# Patient Record
Sex: Male | Born: 1950 | Race: White | Hispanic: No | Marital: Married | Smoking: Former smoker
Health system: Southern US, Community
[De-identification: ages and names within clinical notes are randomized; demographics above are authoritative.]

## PROBLEM LIST (undated history)

## (undated) DIAGNOSIS — K219 Gastro-esophageal reflux disease without esophagitis: Secondary | ICD-10-CM

## (undated) DIAGNOSIS — E291 Testicular hypofunction: Secondary | ICD-10-CM

## (undated) DIAGNOSIS — I10 Essential (primary) hypertension: Secondary | ICD-10-CM

## (undated) DIAGNOSIS — G4733 Obstructive sleep apnea (adult) (pediatric): Principal | ICD-10-CM

## (undated) DIAGNOSIS — Z8719 Personal history of other diseases of the digestive system: Secondary | ICD-10-CM

## (undated) DIAGNOSIS — J342 Deviated nasal septum: Secondary | ICD-10-CM

## (undated) DIAGNOSIS — E785 Hyperlipidemia, unspecified: Secondary | ICD-10-CM

## (undated) DIAGNOSIS — M1990 Unspecified osteoarthritis, unspecified site: Secondary | ICD-10-CM

## (undated) DIAGNOSIS — T1490XA Injury, unspecified, initial encounter: Secondary | ICD-10-CM

## (undated) HISTORY — PX: TOOTH EXTRACTION: SUR596

## (undated) HISTORY — PX: MOUTH SURGERY: SHX715

## (undated) HISTORY — PX: CARDIAC CATHETERIZATION: SHX172

## (undated) HISTORY — PX: OTHER SURGICAL HISTORY: SHX169

## (undated) HISTORY — PX: TONSILLECTOMY: SUR1361

## (undated) HISTORY — DX: Hyperlipidemia, unspecified: E78.5

## (undated) HISTORY — DX: Obstructive sleep apnea (adult) (pediatric): G47.33

## (undated) HISTORY — DX: Testicular hypofunction: E29.1

---

## 1998-06-18 ENCOUNTER — Ambulatory Visit (HOSPITAL_COMMUNITY): Admission: RE | Admit: 1998-06-18 | Discharge: 1998-06-18 | Payer: Self-pay | Admitting: Gastroenterology

## 1999-12-08 ENCOUNTER — Inpatient Hospital Stay (HOSPITAL_COMMUNITY): Admission: EM | Admit: 1999-12-08 | Discharge: 1999-12-09 | Payer: Self-pay | Admitting: Emergency Medicine

## 1999-12-08 ENCOUNTER — Encounter: Payer: Self-pay | Admitting: Emergency Medicine

## 2005-01-04 ENCOUNTER — Inpatient Hospital Stay (HOSPITAL_COMMUNITY): Admission: RE | Admit: 2005-01-04 | Discharge: 2005-01-07 | Payer: Self-pay | Admitting: Orthopedic Surgery

## 2005-12-21 ENCOUNTER — Ambulatory Visit: Payer: Self-pay | Admitting: Sports Medicine

## 2005-12-23 IMAGING — CR DG HIP 1V PORT*R*
1 series · 1 of 1 positions shown · non-contrast
Comparison: none

CLINICAL DATA: Osteoarthritis.  Status-post right THR.
 PORTABLE RIGHT HIP:
 Two views of the right hip made with the portable apparatus reveals a total hip replacement to be in place on the right.  The alignment of the proximal femoral and acetabular components are thought to be satisfactory.  The most distal extent of the femoral component is not seen to full advantage but appears to be in satisfactory position.

[view not recorded]
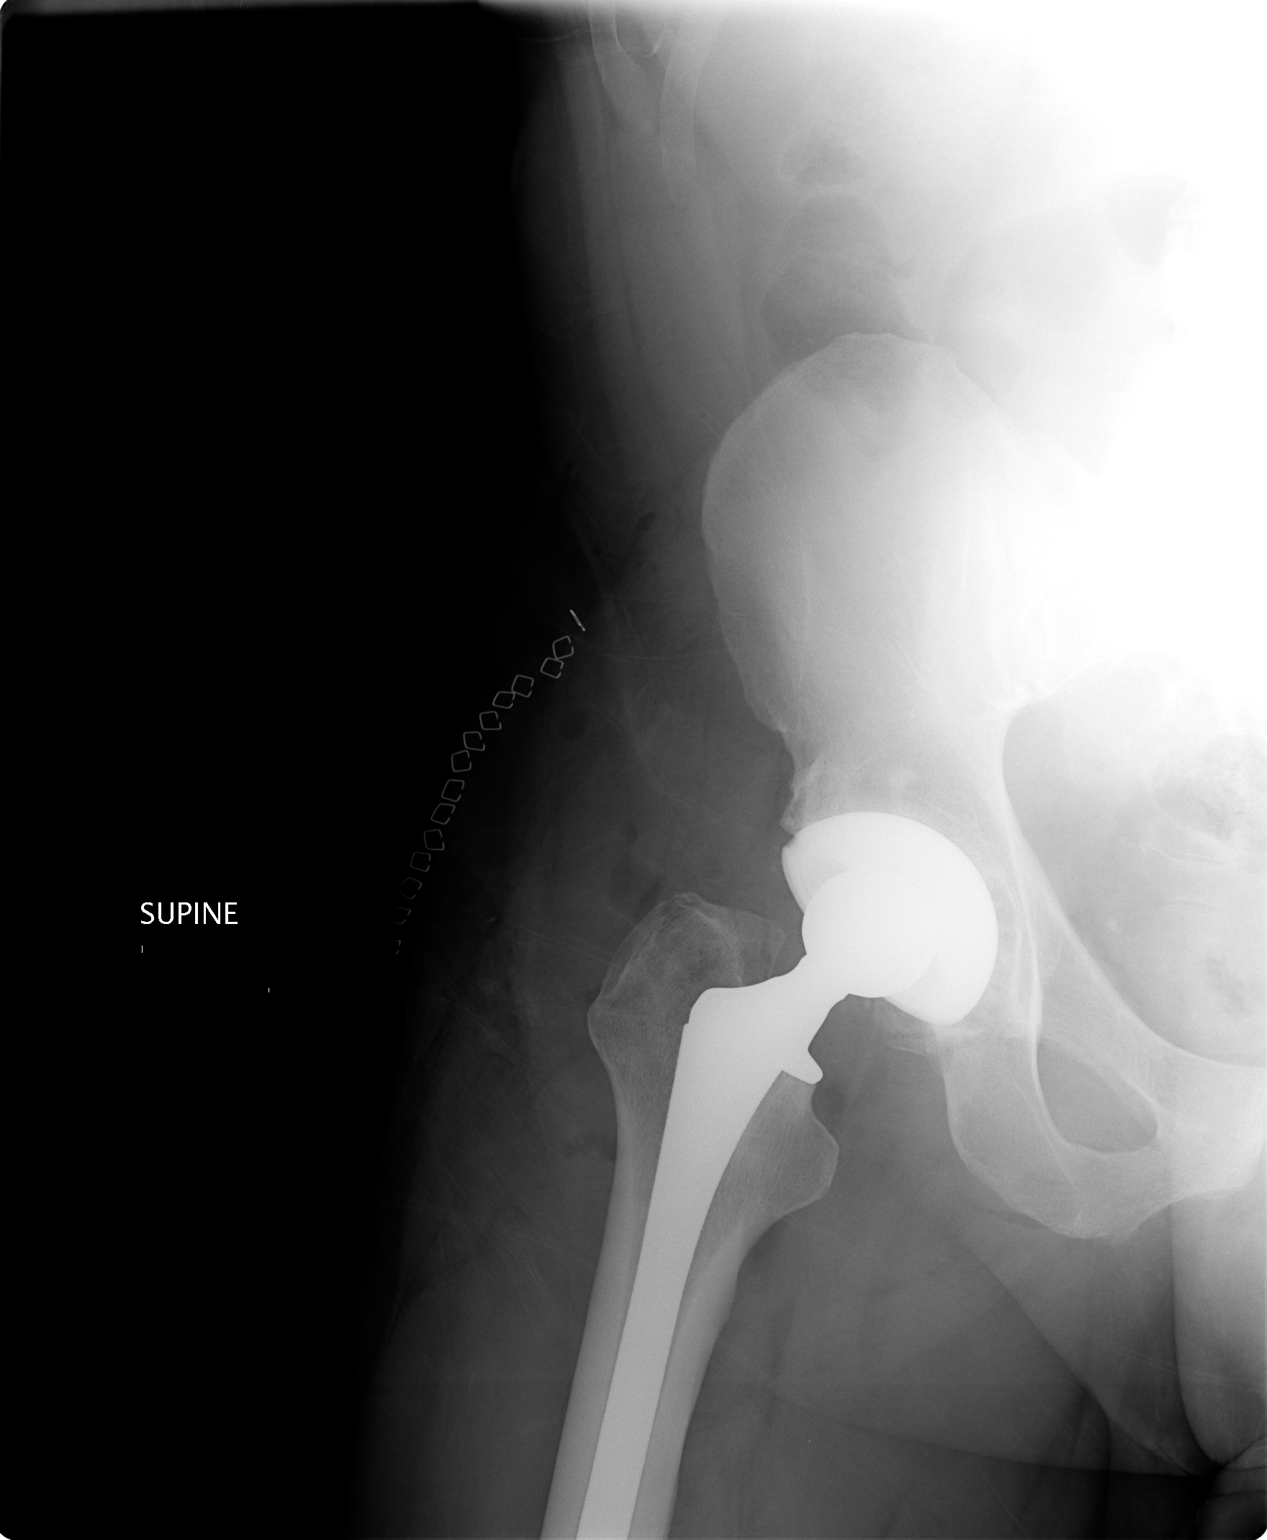

[1 of 1 positions shown; findings below may reference images not displayed]

IMPRESSION: Right total hip replacement appears to be in satisfactory position and alignment.

## 2008-09-04 ENCOUNTER — Ambulatory Visit (HOSPITAL_COMMUNITY): Admission: RE | Admit: 2008-09-04 | Discharge: 2008-09-04 | Payer: Self-pay | Admitting: Orthopedic Surgery

## 2011-04-30 NOTE — Discharge Summary (Signed)
Charles [name], [name]              ACCOUNT NO.:  [id number]   MEDICAL RECORD NO.:  [id number]          PATIENT TYPE:  INP   LOCATION:  5015                         FACILITY:  [facility]   PHYSICIAN:  Legrand Pitts. Duffy, P.A.   DATE OF BIRTH:  1951-06-27   DATE OF ADMISSION:  01/04/2005  DATE OF DISCHARGE:  01/07/2005                                 DISCHARGE SUMMARY   ADMISSION DIAGNOSES:  1.  End stage osteoarthritis, right hip.  2.  Hypertension with mild white coat syndrome.  3.  Degenerative disk disease, cervical spine.  4.  Deviated septum.  5.  History of gastroesophageal reflux disease.  6.  History of collapsed tympanic membrane on the left side.   DISCHARGE DIAGNOSES:  1.  End stage osteoarthritis, right hip, status post right total hip      arthroplasty, anterior approach.  2.  Acute blood loss anemia secondary to surgery.  3.  Pyrexia.  4.  Hypertension with mild white coat syndrome.  5.  Degenerative disk disease, cervical spine.  6.  Deviated septum.  7.  History of gastroesophageal reflux disease.  8.  History of collapsed tympanic membrane on the left.   SURGICAL PROCEDURE:  On January 04, 2005, Mr. Charles [name] underwent a right  total hip arthroplasty anterior approach by Mila Homer. Sherlean Foot, M.D. assisted  by Jamelle Rushing, P.A. and [place], [place] student.  He had a Trilogy  acetabular system shell without holes, porous coat 60 mm outer diameter with  a Trilogy acetabular system, longevity cross-linked polyethylene liner 32 mm  inner diameter 60 mg outer diameter.  A VerSys hip femoral stem, beaded full  coat 12-14 neck taper, standard neck offset size 12 standard body, 160 mm  stem length. And a VerSys femoral head 12-14 taper, nonskirted 32 mm  diameter +7 mm neck length.   COMPLICATIONS:  None.   CONSULTATIONS:  1.  Physical therapy and case management consult, January 05, 2005.  2.  Case management consult advanced home care January 06, 2005.   HISTORY OF PRESENT  ILLNESS:  This 60 year old white male patient presented  to Dr. Sherlean Foot with an 8 year history of gradual onset progressive right hip  pain.  It is now intermittent dull, aching sensation in the right groin with  occasional radiation to his knee and midtibia.  It increases with impact  activities or walking and decreases with avoidance of these activities  It  catches.  He has difficult putting on his socks and shoes.  X-rays show  arthritic changes and he has failed conservative treatment.  Because of  this, he is presenting for a right hip replacement.   HOSPITAL COURSE:  Charles [name] tolerated the surgical procedure well without  immediately postoperative complications.  He was transferred to 5000.  On  postoperative day 1 he had some dizziness when out of bed.  T-max 100.8,  vitals otherwise stable with blood pressure a bit low at 108/69.  Hemoglobin  was 10.6, hematocrit 30.6, leg was neurovascularly intact.  Pain meds were  switched to do the nausea and dizziness.  Blood pressure  meds were held.  He  was started on therapy per protocol.   He did well over the next several days.  The dizziness resolved.  T-max on  January 06, 2005 was 101.1.  He did have some swelling in the thigh and that  was treated with compression and holding the Lovenox for one dose.  On  January 07, 2005 he is ready for discharge home and he will be discharged  home later today.   DISCHARGE INSTRUCTIONS:  Diet:  He can resume his regular prehospitalization  diet.  Medications:  He may resume his prehospitalization meds.  These include  1.  Lotrel 5/20 mg one tablet p.o. q.a.m.  2.  Hydrochlorothiazide 25 mg p.o. q.a.m.  3.  Chondroitin and glucosamine 600/750 two tablets p.o. q.a.m.  Additional medications at this time include  1.  Lovenox 40 mg subcu q. day with the last dose to be January 18, 2005.  2.  Vicodin 5/500 mg one to two tablets p.o. q.4h. p.r.n. for pain, max of      eight a day.  50 of these  with no refill.  Activity:  Can be out of bed weightbearing as tolerated on the right leg  with the use of a walker.  He is to continue physical therapy, home health  physical therapy for advanced home care.  Please see the blue total knee  discharge sheet for further activity instructions.  Wound care:  Please keep the right hip incision clean and dry.  Can apply  Ace wraps as needed to help with the swelling.  Please see the blue total  knee discharge sheet for further wound care instructions.   FOLLOW UP:  He needs to follow up with Dr. Sherlean Foot in our office on February  7.  He needs to call [phone] for that appointment.   LABORATORY DATA:  Chest x-ray done on December 31, 2004 showed mild chronic  bronchitic changes.  Hip x-ray done on January 23 showed the right hip  replacement in satisfactory position and alignment.   January 24, hemoglobin 10.6, hematocrit 30.6.  On January 25, hemoglobin  9.7, hematocrit 28 and on January 26, hemoglobin 9.9, hematocrit 28.3, white  blood cell count 7200 and platelet count 243,000.  On January 19, glucose  113.  On January 24 it was 122 with calcium 8.3 and on January 25 glucose  115, BUN 4, creatinine 1, and calcium 8.3.  All other laboratory studies  were within normal limits.      KED/MEDQ  D:  01/07/2005  T:  01/07/2005  Job:  [id number]

## 2011-04-30 NOTE — Discharge Summary (Signed)
[place]. [facility]  Patient:    Charles [name]                      MRN: [id number] Adm. Date:  [id number] Disc. Date: 12/09/99 Attending:  Alric Quan Dictator:   Leonides Cave, P.A. CC:         Cecil Cranker, M.D. [facility]             Teena Irani. Arlyce Dice, M.D., primary care                  Referring Physician Discharge [place]  DATE OF BIRTH:  [birth year]  DISCHARGE DIAGNOSES: 1. Noncardiac chest pain with a normal cardiac catheterization on December 09, 1999. 2. Hypertension.  HISTORY:  A 60 year old white male with no previous cardiac history.  Woke up on the morning of December 08, 1999 with intermittent left arm pain.  Started complaining of nausea, dizziness, and mild shortness of breath and had a presyncopal spell, and went outside and felt better on the morning of admission. Patient denied chest pain but states he did have his arm pain at the time of the spell.  The patient felt fine since but had never had a similar episode.  He went to see primary M.D. at [facility], who sent him to the emergency department for further evaluation.  HOSPITAL COURSE:  The patient was set up for cardiac catheterization, which was  performed by Dr. Veneda Melter on December 09, 1999.  Results are left main normal; LAD with three diagonals with very minimal disease; left circumflex with a trivial obtuse marginal one branch and bifurcating OM-2.  The RCA was dominant with a very mild disease.  There was no mitral regurgitation and patient had a normal LV with an ejection fraction of greater than 60%.  The diagnosis was noncardiac chest pain and normal LV function.  DISPOSITION:  Patient was subsequently discharged home with a trial of Prilosec 20 mg q.d.  He was told to continue his Prinivil 20 mg q.d.  ACTIVITY:  Patient was instructed to undergo no heavy lifting, driving, or sexual activity for two days.  DIET:  He was  told to adhere to a low fat/low cholesterol diet.  SITE CARE:  He was told that if bleeding or swelling occurred at the catheterization site he should call [facility] office immediately.  FOLLOW-UP:  He will not need to follow-up in the [facility] office.  He can follow up with Dr. Arlyce Dice, his primary care physician, as needed or if his symptoms persist. DD:  12/09/99 TD:  12/09/99 Job: [id number]

## 2011-04-30 NOTE — H&P (Signed)
Charles [name], Charles [name]              ACCOUNT NO.:  [id number]   MEDICAL RECORD NO.:  [id number]          PATIENT TYPE:  INP   LOCATION:  NA                           FACILITY:  [facility]   PHYSICIAN:  Mila Homer. Sherlean Foot, M.D. DATE OF BIRTH:  [birth year]   DATE OF ADMISSION:  DATE OF DISCHARGE:                                HISTORY & PHYSICAL   DATE OF ADMISSION:  January 04, 2005   CHIEF COMPLAINT:  Right hip pain for the last 8 years.   HISTORY OF PRESENT ILLNESS:  This 60 year old white male presented to Dr.  Sherlean Foot with an 8-year history of gradual onset but progressively worsening  right hip pain for the past 8 years.  The pain has only gotten worse over  the last 12 months or so.  He has no known injury or prior surgery to his  hip but he is having problems with the pain and decreased range of motion.   At this point the pain is pretty much an intermittent, dull, achy sensation  in the right groin with occasional radiation down to his knee and his mid  tibia.  The pain increases with any impact activities or walking and then  decreases if he just avoids those activities.  The hip does feel like it  catches at times.  He has some difficulty putting on his socks and shoes and  the pain does occasionally keep him up at night.  There is no grinding,  locking, or giving way.  He denies any paresthesias associated with the pain  or any back pain.  He is currently taking chondroitin and glucosamine but no  other medicines for pain.  He has not received any cortisone injections.   ALLERGIES:  No known drug allergies.   CURRENT MEDICATIONS:  1.  Lotrel 5/20 mg one tablet p.o. q.a.m.  2.  Hydrochlorothiazide 25 mg one tablet p.o. q.a.m.  3.  Glucosamine and chondroitin sulfate 750/600 mg two tablets p.o. q.a.m.   PAST MEDICAL HISTORY:  1.  He has had hypertension for the last 20 years.  2.  Questionable history of reflux or a hiatal hernia in 1988 and 1989 but      no problem since that  time.   He denies any history of diabetes mellitus, thyroid disease, peptic ulcer  disease, heart disease, asthma, or any other chronic medical condition other  than noted previously.   PAST SURGICAL HISTORY:  1.  Oral surgery in 1978 and 1980.  2.  Myringotomy tube as a child.  3.  Myringotomy tube left ear by Dr. Renato Battles in about 1999 or 2000.  4.  Tonsillectomy at age 73.  5.  LASIK surgery on both eyes by Dr. Delaney Meigs in 2003.   He denies any complications from the above-mentioned procedures.   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  He does not smoke or use any drugs.  He is  married and has two sons.  He and his wife live in a two-story house with  six steps into the main entrance.  His bedroom is on the second floor.  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  His medical doctor is Dr. Dara Hoyer at [facility] and  his phone number is [phone].   FAMILY HISTORY:  Mother died at the age of 48 with lung cancer.  He does not  know anything about his father.  He has one brother who passed away at age  23 with a car accident.  His sons are age 37 and 16 and they are alive and  well.   REVIEW OF SYSTEMS:  He does have a history of a collapsed tympanic membrane  and has some chronic tinnitus due to that.  He has some degenerative disc  disease in his cervical spine at the C4-5 level.  He does have a deviated  septum.  He is complaining of some right shoulder impingement syndrome.  He  will have a Living Will over the next week and a power of attorney is his  wife, Charles [name].  All other systems are negative and  noncontributory.   PHYSICAL EXAMINATION:  GENERAL:  Well-developed, well-nourished white male  in no acute distress.  Talks easily with the examiner.  Walks with a very  slight limp.  Mood and affect are appropriate.  Height 6 feet 0  inches,  weight 226 pounds, BMI is 29.5.  VITAL SIGNS:  Temperature 98.7 degrees Fahrenheit, pulse 72, respirations  12, and blood pressure 148/100.  At the end of the interview, his blood  pressure was 118/74.  HEENT:  Normocephalic, atraumatic, without frontal or maxillary sinus  tenderness to palpation.  Conjunctivae pink, sclerae anicteric.  PERRLA.  EOMs intact.  No visual external ear deformities.  Hearing grossly intact.  Tympanic membrane on the right is pearly gray with good light reflex.  The  left one appears to be fairly scarred and I cannot see a light reflex.  Nose  and nasal septum midline.  Nasal mucosa pink and moist without exudates or  polyps noted.  Buccal mucosa pink and moist.  Good dentition.  Pharynx  without erythema or exudates.  Tongue and uvula midline.  Tongue without  vesiculations and uvula rises equally with phonation.  NECK:  No visible masses or lesions noted.  Trachea midline.  No palpable  lymphadenopathy nor thyromegaly.  Carotids +2 bilaterally without bruits.  Full range of motion and nontender to palpation along the cervical spine.  CARDIOVASCULAR:  Heart rate and rhythm regular.  S1 and S2 present without  rubs, clicks, or murmurs noted.  RESPIRATORY:  Respirations even and unlabored.  Breath sounds clear to  auscultation bilaterally without rales or wheezes noted.  ABDOMEN:  Rounded abdominal contour.  Bowel sounds present x4 quadrants.  Soft, nontender to palpation without hepatosplenomegaly nor CVA tenderness.  Femoral pulses +2 bilaterally.  Nontender to palpation along the vertebral  column.  BREASTS/GENITOURINARY/RECTAL:  These exams deferred at this time.  MUSCULOSKELETAL:  No obvious deformities bilateral upper extremities with  full range of motion of these extremities without pain.  Radial pulses are  +2.  He has full range of motion of his knees, ankles, and toes bilaterally. DP and PT pulses are +2.  No lower extremity edema, no calf  pain with  palpation, and negative Homan's sign bilaterally.  Left hip has full  extension with flexion to 100 degrees with good internal-external rotation  that does not cause pain.  Good abduction.  Knee has full extension and  flexion to 130 degrees.  No pain with palpation about the left groin.  No  pain with palpation about the right groin.  He has full extension but  flexion only to 90 degrees at this time.  Anywhere past 90 degrees he starts  to externally rotate.  When you attempt internal-external rotation his  pelvis rocks so these motions are very limited.  He does complain of pain  with internal-external rotation.  NEUROLOGIC:  Alert and oriented x3.  Cranial nerves II-XII are grossly  intact.  Strength 5/5 bilateral upper and lower extremities.  Rapid  alternating movements intact.  Deep tendon reflexes 2+ bilateral upper and  lower extremities.  Sensation intact to light touch.   RADIOLOGIC FINDINGS:  X-rays taken of his right hip in October 2005 show end-  stage osteoarthritis.  This seems to have progressed from x-rays that we had  seen from Dr. Blenda Bridegroom office in 2003.   IMPRESSION:  1.  End-stage osteoarthritis right hip.  2.  Hypertension with some white coat syndrome.  3.  History of gastroesophageal reflux disease.  4.  History of collapsed tympanic membrane on the left.  5.  Degenerative disc disease cervical spine.  6.  Deviated septum.   PLAN:  Charles [name] will be admitted to [facility] on January 04, 2005 where he will undergo a right total hip arthroplasty by Dr. Mila Homer.  [place].  He will undergo all the routine preoperatively laboratory tests and  studies prior to this procedure.  If we have any medical issues while he is  hospitalized, we will consult Dr. Arlyce Dice.       KED/MEDQ  D:  12/24/2004  T:  12/24/2004  Job:  [id number]

## 2011-04-30 NOTE — Op Note (Signed)
Charles [name], Charles [name]              ACCOUNT NO.:  [id number]   MEDICAL RECORD NO.:  [id number]          PATIENT TYPE:  INP   LOCATION:  2550                         FACILITY:  [facility]   PHYSICIAN:  Mila Homer. Sherlean Foot, M.D. DATE OF BIRTH:  08/15/1951   DATE OF PROCEDURE:  01/04/2005  DATE OF DISCHARGE:                                 OPERATIVE REPORT   PREOPERATIVE DIAGNOSIS:  Right hip osteoarthritis.   POSTOPERATIVE DIAGNOSIS:  Right hip osteoarthritis.   PROCEDURE:  Right total hip arthroplasty.   SURGEON:  Mila Homer. Sherlean Foot, M.D.   ASSISTANT:  Jamelle Rushing, P.A.   ANESTHESIA:  General.   INDICATIONS FOR PROCEDURE:  The patient is a 60 year old with significant  osteoarthritis and failure of conservative measures.  Informed consent was  obtained.   DESCRIPTION OF PROCEDURE:  The patient was laid supine under general  anesthesia, then placed in the left lateral decubitus position.  After the  right hip was sterilely prepped and draped in the usual fashion, an anterior  approach to the hip was performed.  This was a minimally invasive approach  and went from the center of the trochanter to 2 cm cephalad and lateral to  the ASIS.  Cautery was used to obtain hemostasis and dissect down to the  fascia lata.  The fascia lata was incised along the length of the incision.  We then found the leading edge of the gluteus medius and retracted this  behind the superior femoral neck.  We placed a retractor inferior as well  and performed an anterior hip capsulectomy.  At this point, I then made our  neck cut with a template and subcutaneous saw.  I did not dislocate the hip.  I removed the intercalary segment and removed the femoral head in this  fashion.  We then removed the labrum circumferentially and then reamed  sequentially up to 58 and put in a 60 mm no hole, no spike cup.  We then put  in the real 2 mm liner except in the 32 mm head.  I then externally rotated,  flexed and abducted  the leg into a sterile pouch.  We placed a Mill  retractor to elevate the femur anteriorly.  I then used a canal finder and  sequential reamers to ream up to a size 12 reamer and broached to a 12 as  well and then trialed with multiple head sizes and felt that a +7 was the  appropriate size.  Leg length on the table also appeared to match  preoperative levels.  I then removed the trial components and copiously  irrigated and tapped down a fully porous coated size 12 stem, snapped on a 7  x 32 mm head and located the hip.  Stability was excellent.  I then  irrigated and closed with interrupted #1 Vicryl, 0 Vicryl, 2-0 Vicryl and  skin staples.   DRAINS:  None.   COMPLICATIONS:  None.   ESTIMATED BLOOD LOSS:  300 mL.  ______________________________  Mila Homer Sherlean Foot, M.D.    SDL/MEDQ  D:  01/04/2005  T:  01/04/2005  Job:  [id number]
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
## 2015-02-05 ENCOUNTER — Encounter (HOSPITAL_COMMUNITY)
Admission: RE | Admit: 2015-02-05 | Discharge: 2015-02-05 | Disposition: A | Discharge status: Home / Self Care | Payer: 59 | Source: Ambulatory Visit | Attending: Orthopedic Surgery | Admitting: Orthopedic Surgery

## 2015-02-05 ENCOUNTER — Encounter (HOSPITAL_COMMUNITY): Payer: Self-pay | Admitting: *Deleted

## 2015-02-05 ENCOUNTER — Encounter (HOSPITAL_COMMUNITY): Payer: Self-pay

## 2015-02-05 DIAGNOSIS — Z01818 Encounter for other preprocedural examination: Secondary | ICD-10-CM | POA: Insufficient documentation

## 2015-02-05 DIAGNOSIS — M1612 Unilateral primary osteoarthritis, left hip: Secondary | ICD-10-CM | POA: Insufficient documentation

## 2015-02-05 HISTORY — DX: Essential (primary) hypertension: I10

## 2015-02-05 HISTORY — DX: Personal history of other diseases of the digestive system: Z87.19

## 2015-02-05 HISTORY — DX: Gastro-esophageal reflux disease without esophagitis: K21.9

## 2015-02-05 HISTORY — DX: Unspecified osteoarthritis, unspecified site: M19.90

## 2015-02-05 HISTORY — DX: Deviated nasal septum: J34.2

## 2015-02-05 LAB — URINALYSIS, ROUTINE W REFLEX MICROSCOPIC
Glucose, UA: NEGATIVE mg/dL
Hgb urine dipstick: NEGATIVE
Ketones, ur: NEGATIVE mg/dL
Leukocytes, UA: NEGATIVE
Nitrite: NEGATIVE
Protein, ur: NEGATIVE mg/dL
Specific Gravity, Urine: 1.022 (ref 1.005–1.030)
Urobilinogen, UA: 0.2 mg/dL (ref 0.0–1.0)
pH: 6 (ref 5.0–8.0)

## 2015-02-05 LAB — BASIC METABOLIC PANEL
Anion gap: 8 (ref 5–15)
BUN: 19 mg/dL (ref 6–23)
CHLORIDE: 100 mmol/L (ref 96–112)
CO2: 29 mmol/L (ref 19–32)
CREATININE: 1.1 mg/dL (ref 0.50–1.35)
Calcium: 9.3 mg/dL (ref 8.4–10.5)
GFR calc non Af Amer: 70 mL/min — ABNORMAL LOW (ref >90)
GFR, EST AFRICAN AMERICAN: 81 mL/min — AB (ref >90)
Glucose, Bld: 102 mg/dL — ABNORMAL HIGH (ref 70–99)
Potassium: 4 mmol/L (ref 3.5–5.1)
Sodium: 137 mmol/L (ref 135–145)

## 2015-02-05 LAB — CBC
HEMATOCRIT: 45.5 % (ref 39.0–52.0)
HEMOGLOBIN: 15 g/dL (ref 13.0–17.0)
MCH: 30.8 pg (ref 26.0–34.0)
MCHC: 33 g/dL (ref 30.0–36.0)
MCV: 93.4 fL (ref 78.0–100.0)
Platelets: 205 10*3/uL (ref 150–400)
RBC: 4.87 MIL/uL (ref 4.22–5.81)
RDW: 12.2 % (ref 11.5–15.5)
WBC: 6.8 10*3/uL (ref 4.0–10.5)

## 2015-02-05 LAB — PROTIME-INR
INR: 1.04 (ref 0.00–1.49)
PROTHROMBIN TIME: 13.7 s (ref 11.6–15.2)

## 2015-02-05 LAB — SURGICAL PCR SCREEN
MRSA, PCR: NEGATIVE
STAPHYLOCOCCUS AUREUS: NEGATIVE

## 2015-02-05 LAB — APTT: APTT: 33 s (ref 24–37)

## 2015-02-05 LAB — ABO/RH: ABO/RH(D): O POS

## 2015-02-05 NOTE — Progress Notes (Signed)
Your patient has screened at an elevated risk for Obstructive Sleep Apnea using the Stop-Bang Tool during a pre-surgical vist. A score of 4 or greater is an elevated risk Score of 7. 

## 2015-02-05 NOTE — Progress Notes (Signed)
Clearance note per chart per Dr Eloise HarmanPaterson 08/23/2014

## 2015-02-05 NOTE — Patient Instructions (Signed)
20 Charles SakaiRobert L [name]  02/05/2015   Your procedure is scheduled on:     Tuesday February 11, 2015   [facility] and follow signs to  Short Stay Center arrive at 0830 AM.   Call this number if you have problems the morning of surgery [phone] or Presurgical Testing [phone].   Remember:  Do not eat food or drink liquids :After Midnight.  For Living Will and/or Health Care Power Attorney Forms: please provide copy for your medical record, may bring AM of surgery (forms should be already notarized-we do not provide this service).     Take these medicines the morning of surgery with A SIP OF WATER: NONE                                You may not have any metal on your body including hair pins and piercings  Do not wear jewelry, lotions, powders, colognes  or deodorant.  Men may shave face and neck.               Do not bring valuables to the hospital. [facility] IS NOT RESPONSIBLE FOR VALUABLES.  Contacts, dentures or bridgework may not be worn into surgery.  Leave suitcase in the car. After surgery it may be brought to your room.  For patients admitted to the hospital, checkout time is 11:00 AM the day of discharge.     Special Instructions: review fact sheets for MRSA information, Blood Transfusion fact sheet, Incentive Spirometry.  Remember: Type/Screen "Blue armsbands"- may not be removed once applied(would result in being retested AM of surgery, if removed). ________________________________________________________________________  [facility] - Preparing for [facility] Before surgery, you can play an important role.  Because skin is not sterile, your skin needs to be as free of germs as possible.  You can reduce the number of germs on your skin by washing with CHG (chlorahexidine gluconate) soap before surgery.  CHG is an antiseptic cleaner which kills germs and bonds with the skin to continue killing germs even after washing. Please DO NOT use if you  have an allergy to CHG or antibacterial soaps.  If your skin becomes reddened/irritated stop using the CHG and inform your nurse when you arrive at Short Stay. Do not shave (including legs and underarms) for at least 48 hours prior to the first CHG shower.  You may shave your face/neck. Please follow these instructions carefully:  1.  Shower with CHG Soap the night before surgery and the  morning of Surgery.  2.  If you choose to wash your hair, wash your hair first as usual with your  normal  shampoo.  3.  After you shampoo, rinse your hair and body thoroughly to remove the  shampoo.                           4.  Use CHG as you would any other liquid soap.  You can apply chg directly  to the skin and wash                       Gently with a scrungie or clean washcloth.  5.  Apply the CHG Soap to your body ONLY FROM THE NECK DOWN.   Do not use on face/ open  Wound or open sores. Avoid contact with eyes, ears mouth and genitals (private parts).                       Wash face,  Genitals (private parts) with your normal soap.             6.  Wash thoroughly, paying special attention to the area where your surgery  will be performed.  7.  Thoroughly rinse your body with warm water from the neck down.  8.  DO NOT shower/wash with your normal soap after using and rinsing off  the CHG Soap.                9.  Pat yourself dry with a clean towel.            10.  Wear clean pajamas.            11.  Place clean sheets on your bed the night of your first shower and do not  sleep with pets. Day of Surgery : Do not apply any lotions/deodorants the morning of surgery.  Please wear clean clothes to the hospital/surgery center.  FAILURE TO FOLLOW THESE INSTRUCTIONS MAY RESULT IN THE CANCELLATION OF YOUR SURGERY PATIENT SIGNATURE_________________________________  NURSE  SIGNATURE__________________________________  ________________________________________________________________________   Charles [name]  An incentive spirometer is a tool that can help keep your lungs clear and active. This tool measures how well you are filling your lungs with each breath. Taking long deep breaths may help reverse or decrease the chance of developing breathing (pulmonary) problems (especially infection) following:  A long period of time when you are unable to move or be active. BEFORE THE PROCEDURE   If the spirometer includes an indicator to show your best effort, your nurse or respiratory therapist will set it to a desired goal.  If possible, sit up straight or lean slightly forward. Try not to slouch.  Hold the incentive spirometer in an upright position. INSTRUCTIONS FOR USE   Sit on the edge of your bed if possible, or sit up as far as you can in bed or on a chair.  Hold the incentive spirometer in an upright position.  Breathe out normally.  Place the mouthpiece in your mouth and seal your lips tightly around it.  Breathe in slowly and as deeply as possible, raising the piston or the ball toward the top of the column.  Hold your breath for 3-5 seconds or for as long as possible. Allow the piston or ball to fall to the bottom of the column.  Remove the mouthpiece from your mouth and breathe out normally.  Rest for a few seconds and repeat Steps 1 through 7 at least 10 times every 1-2 hours when you are awake. Take your time and take a few normal breaths between deep breaths.  The spirometer may include an indicator to show your best effort. Use the indicator as a goal to work toward during each repetition.  After each set of 10 deep breaths, practice coughing to be sure your lungs are clear. If you have an incision (the cut made at the time of surgery), support your incision when coughing by placing a pillow or rolled up towels firmly against it. Once  you are able to get out of bed, walk around indoors and cough well. You may stop using the incentive spirometer when instructed by your caregiver.  RISKS AND COMPLICATIONS  Take your time so you do not  get dizzy or light-headed.  If you are in pain, you may need to take or ask for pain medication before doing incentive spirometry. It is harder to take a deep breath if you are having pain. AFTER USE  Rest and breathe slowly and easily.  It can be helpful to keep track of a log of your progress. Your caregiver can provide you with a simple table to help with this. If you are using the spirometer at home, follow these instructions: [place] IF:   You are having difficultly using the spirometer.  You have trouble using the spirometer as often as instructed.  Your pain medication is not giving enough relief while using the spirometer.  You develop fever of 100.5 F (38.1 C) or higher. SEEK IMMEDIATE MEDICAL CARE IF:   You cough up bloody sputum that had not been present before.  You develop fever of 102 F (38.9 C) or greater.  You develop worsening pain at or near the incision site. MAKE SURE YOU:   Understand these instructions.  Will watch your condition.  Will get help right away if you are not doing well or get worse. Document Released: 04/11/2007 Document Revised: 02/21/2012 Document Reviewed: 06/12/2007 Exit[employer] Patient Information 2014 Exit[employer], [place].   ________________________________________________________________________  WHAT IS A BLOOD TRANSFUSION? Blood Transfusion Information  A transfusion is the replacement of blood or some of its parts. Blood is made up of multiple cells which provide different functions.  Red blood cells carry oxygen and are used for blood loss replacement.  White blood cells fight against infection.  Platelets control bleeding.  Plasma helps clot blood.  Other blood products are available for specialized needs, such as  hemophilia or other clotting disorders. BEFORE THE TRANSFUSION  Who gives blood for transfusions?   Healthy volunteers who are fully evaluated to make sure their blood is safe. This is blood bank blood. Transfusion therapy is the safest it has ever been in the practice of medicine. Before blood is taken from a donor, a complete history is taken to make sure that person has no history of diseases nor engages in risky social behavior (examples are intravenous drug use or sexual activity with multiple partners). The donor's travel history is screened to minimize risk of transmitting infections, such as malaria. The donated blood is tested for signs of infectious diseases, such as HIV and hepatitis. The blood is then tested to be sure it is compatible with you in order to minimize the chance of a transfusion reaction. If you or a relative donates blood, this is often done in anticipation of surgery and is not appropriate for emergency situations. It takes many days to process the donated blood. RISKS AND COMPLICATIONS Although transfusion therapy is very safe and saves many lives, the main dangers of transfusion include:   Getting an infectious disease.  Developing a transfusion reaction. This is an allergic reaction to something in the blood you were given. Every precaution is taken to prevent this. The decision to have a blood transfusion has been considered carefully by your caregiver before blood is given. Blood is not given unless the benefits outweigh the risks. AFTER THE TRANSFUSION  Right after receiving a blood transfusion, you will usually feel much better and more energetic. This is especially true if your red blood cells have gotten low (anemic). The transfusion raises the level of the red blood cells which carry oxygen, and this usually causes an energy increase.  The nurse administering the transfusion will  monitor you carefully for complications. HOME CARE INSTRUCTIONS  No special  instructions are needed after a transfusion. You may find your energy is better. Speak with your caregiver about any limitations on activity for underlying diseases you may have. SEEK MEDICAL CARE IF:   Your condition is not improving after your transfusion.  You develop redness or irritation at the intravenous (IV) site. SEEK IMMEDIATE MEDICAL CARE IF:  Any of the following symptoms occur over the next 12 hours:  Shaking chills.  You have a temperature by mouth above 102 F (38.9 C), not controlled by medicine.  Chest, back, or muscle pain.  People around you feel you are not acting correctly or are confused.  Shortness of breath or difficulty breathing.  Dizziness and fainting.  You get a rash or develop hives.  You have a decrease in urine output.  Your urine turns a dark color or changes to pink, red, or brown. Any of the following symptoms occur over the next 10 days:  You have a temperature by mouth above 102 F (38.9 C), not controlled by medicine.  Shortness of breath.  Weakness after normal activity.  The white part of the eye turns yellow (jaundice).  You have a decrease in the amount of urine or are urinating less often.  Your urine turns a dark color or changes to pink, red, or brown. Document Released: 11/26/2000 Document Revised: 02/21/2012 Document Reviewed: 07/15/2008 [facility] Patient Information 2014 [place], [place].  _______________________________________________________________________

## 2015-02-11 ENCOUNTER — Inpatient Hospital Stay (HOSPITAL_COMMUNITY): Payer: 59

## 2015-02-11 ENCOUNTER — Encounter (HOSPITAL_COMMUNITY): Payer: Self-pay | Admitting: *Deleted

## 2015-02-11 ENCOUNTER — Inpatient Hospital Stay (HOSPITAL_COMMUNITY): Payer: 59 | Admitting: Anesthesiology

## 2015-02-11 ENCOUNTER — Encounter (HOSPITAL_COMMUNITY)
Admission: RE | Disposition: A | Discharge status: Home Health | Payer: Self-pay | Source: Ambulatory Visit | Attending: Orthopedic Surgery

## 2015-02-11 ENCOUNTER — Inpatient Hospital Stay (HOSPITAL_COMMUNITY)
Admission: RE | Admit: 2015-02-11 | Discharge: 2015-02-12 | DRG: 470 | Disposition: A | Discharge status: Home Health | Payer: 59 | Source: Ambulatory Visit | Attending: Orthopedic Surgery | Admitting: Orthopedic Surgery

## 2015-02-11 DIAGNOSIS — Z87891 Personal history of nicotine dependence: Secondary | ICD-10-CM

## 2015-02-11 DIAGNOSIS — Z96641 Presence of right artificial hip joint: Secondary | ICD-10-CM | POA: Diagnosis present

## 2015-02-11 DIAGNOSIS — K219 Gastro-esophageal reflux disease without esophagitis: Secondary | ICD-10-CM | POA: Diagnosis present

## 2015-02-11 DIAGNOSIS — Z96649 Presence of unspecified artificial hip joint: Secondary | ICD-10-CM

## 2015-02-11 DIAGNOSIS — I10 Essential (primary) hypertension: Secondary | ICD-10-CM | POA: Diagnosis present

## 2015-02-11 DIAGNOSIS — Z01812 Encounter for preprocedural laboratory examination: Secondary | ICD-10-CM

## 2015-02-11 DIAGNOSIS — M1612 Unilateral primary osteoarthritis, left hip: Principal | ICD-10-CM | POA: Diagnosis present

## 2015-02-11 HISTORY — PX: TOTAL HIP ARTHROPLASTY: SHX124

## 2015-02-11 HISTORY — DX: Injury, unspecified, initial encounter: T14.90XA

## 2015-02-11 LAB — TYPE AND SCREEN
ABO/RH(D): O POS
Antibody Screen: NEGATIVE

## 2015-02-11 SURGERY — ARTHROPLASTY, HIP, TOTAL, ANTERIOR APPROACH
Anesthesia: Spinal | Site: Hip | Laterality: Left

## 2015-02-11 MED ORDER — METOCLOPRAMIDE HCL 10 MG PO TABS: 5.0000 mg | ORAL_TABLET | Freq: Three times a day (TID) | ORAL | Status: DC | PRN

## 2015-02-11 MED ORDER — PHENYLEPHRINE 40 MCG/ML (10ML) SYRINGE FOR IV PUSH (FOR BLOOD PRESSURE SUPPORT)
PREFILLED_SYRINGE | INTRAVENOUS | Status: AC
  Filled 2015-02-11: qty 10

## 2015-02-11 MED ORDER — FENTANYL CITRATE 0.05 MG/ML IJ SOLN
INTRAMUSCULAR | Status: AC
  Filled 2015-02-11: qty 2

## 2015-02-11 MED ORDER — PROPOFOL 10 MG/ML IV BOLUS
INTRAVENOUS | Status: AC
  Filled 2015-02-11: qty 20

## 2015-02-11 MED ORDER — FERROUS SULFATE 325 (65 FE) MG PO TABS
325.0000 mg | ORAL_TABLET | Freq: Three times a day (TID) | ORAL | Status: DC
  Administered 2015-02-11 – 2015-02-12 (×2): 325 mg via ORAL
  Filled 2015-02-11 (×6): qty 1

## 2015-02-11 MED ORDER — HYDROCODONE-ACETAMINOPHEN 7.5-325 MG PO TABS
1.0000 | ORAL_TABLET | ORAL | Status: DC
  Administered 2015-02-11: 1 via ORAL
  Administered 2015-02-11: 2 via ORAL
  Administered 2015-02-12: 1 via ORAL
  Administered 2015-02-12 (×2): 2 via ORAL
  Filled 2015-02-11 (×3): qty 2
  Filled 2015-02-11 (×2): qty 1

## 2015-02-11 MED ORDER — PRAVASTATIN SODIUM 20 MG PO TABS
20.0000 mg | ORAL_TABLET | Freq: Every morning | ORAL | Status: DC
  Administered 2015-02-11 – 2015-02-12 (×2): 20 mg via ORAL
  Filled 2015-02-11 (×2): qty 1

## 2015-02-11 MED ORDER — ONDANSETRON HCL 4 MG/2ML IJ SOLN
INTRAMUSCULAR | Status: DC | PRN
  Administered 2015-02-11: 4 mg via INTRAVENOUS

## 2015-02-11 MED ORDER — METOCLOPRAMIDE HCL 5 MG/ML IJ SOLN: 5.0000 mg | Freq: Three times a day (TID) | INTRAMUSCULAR | Status: DC | PRN

## 2015-02-11 MED ORDER — PROPOFOL 10 MG/ML IV BOLUS
INTRAVENOUS | Status: DC | PRN
  Administered 2015-02-11: 20 mg via INTRAVENOUS

## 2015-02-11 MED ORDER — CEFAZOLIN SODIUM-DEXTROSE 2-3 GM-% IV SOLR
2.0000 g | Freq: Four times a day (QID) | INTRAVENOUS | Status: AC
  Administered 2015-02-11 (×2): 2 g via INTRAVENOUS
  Filled 2015-02-11 (×2): qty 50

## 2015-02-11 MED ORDER — BUPIVACAINE HCL (PF) 0.5 % IJ SOLN
INTRAMUSCULAR | Status: DC | PRN
  Administered 2015-02-11: 3 mL

## 2015-02-11 MED ORDER — CEFAZOLIN SODIUM-DEXTROSE 2-3 GM-% IV SOLR
INTRAVENOUS | Status: AC
  Filled 2015-02-11: qty 50

## 2015-02-11 MED ORDER — METHOCARBAMOL 1000 MG/10ML IJ SOLN
500.0000 mg | Freq: Four times a day (QID) | INTRAVENOUS | Status: DC | PRN
  Administered 2015-02-11: 500 mg via INTRAVENOUS
  Filled 2015-02-11 (×2): qty 5

## 2015-02-11 MED ORDER — DIPHENHYDRAMINE HCL 25 MG PO CAPS: 25.0000 mg | ORAL_CAPSULE | Freq: Four times a day (QID) | ORAL | Status: DC | PRN

## 2015-02-11 MED ORDER — FENTANYL CITRATE 0.05 MG/ML IJ SOLN
INTRAMUSCULAR | Status: DC | PRN
  Administered 2015-02-11: 100 ug via INTRAVENOUS

## 2015-02-11 MED ORDER — PROPOFOL INFUSION 10 MG/ML OPTIME
INTRAVENOUS | Status: DC | PRN
  Administered 2015-02-11: 100 ug/kg/min via INTRAVENOUS

## 2015-02-11 MED ORDER — PHENYLEPHRINE HCL 10 MG/ML IJ SOLN
INTRAMUSCULAR | Status: DC | PRN
  Administered 2015-02-11 (×2): 80 ug via INTRAVENOUS

## 2015-02-11 MED ORDER — TRANEXAMIC ACID 100 MG/ML IV SOLN
1000.0000 mg | Freq: Once | INTRAVENOUS | Status: AC
  Administered 2015-02-11: 1000 mg via INTRAVENOUS
  Filled 2015-02-11: qty 10

## 2015-02-11 MED ORDER — ASPIRIN EC 325 MG PO TBEC
325.0000 mg | DELAYED_RELEASE_TABLET | Freq: Two times a day (BID) | ORAL | Status: DC
  Administered 2015-02-12: 325 mg via ORAL
  Filled 2015-02-11 (×3): qty 1

## 2015-02-11 MED ORDER — HYDROMORPHONE HCL 1 MG/ML IJ SOLN: 0.2500 mg | INTRAMUSCULAR | Status: DC | PRN

## 2015-02-11 MED ORDER — MENTHOL 3 MG MT LOZG
1.0000 | LOZENGE | OROMUCOSAL | Status: DC | PRN
  Filled 2015-02-11: qty 9

## 2015-02-11 MED ORDER — ONDANSETRON HCL 4 MG/2ML IJ SOLN: 4.0000 mg | Freq: Four times a day (QID) | INTRAMUSCULAR | Status: DC | PRN

## 2015-02-11 MED ORDER — POTASSIUM CHLORIDE 2 MEQ/ML IV SOLN
100.0000 mL/h | INTRAVENOUS | Status: DC
  Administered 2015-02-11: 100 mL/h via INTRAVENOUS
  Filled 2015-02-11 (×3): qty 1000

## 2015-02-11 MED ORDER — CELECOXIB 200 MG PO CAPS
200.0000 mg | ORAL_CAPSULE | Freq: Two times a day (BID) | ORAL | Status: DC
  Administered 2015-02-11 – 2015-02-12 (×2): 200 mg via ORAL
  Filled 2015-02-11 (×4): qty 1

## 2015-02-11 MED ORDER — LIDOCAINE HCL (CARDIAC) 20 MG/ML IV SOLN
INTRAVENOUS | Status: AC
  Filled 2015-02-11: qty 5

## 2015-02-11 MED ORDER — METHOCARBAMOL 500 MG PO TABS
500.0000 mg | ORAL_TABLET | Freq: Four times a day (QID) | ORAL | Status: DC | PRN
  Administered 2015-02-11 – 2015-02-12 (×2): 500 mg via ORAL
  Filled 2015-02-11 (×2): qty 1

## 2015-02-11 MED ORDER — ALUM & MAG HYDROXIDE-SIMETH 200-200-20 MG/5ML PO SUSP: 30.0000 mL | ORAL | Status: DC | PRN

## 2015-02-11 MED ORDER — ONDANSETRON HCL 4 MG PO TABS: 4.0000 mg | ORAL_TABLET | Freq: Four times a day (QID) | ORAL | Status: DC | PRN

## 2015-02-11 MED ORDER — MAGNESIUM CITRATE PO SOLN: 1.0000 | Freq: Once | ORAL | Status: AC | PRN

## 2015-02-11 MED ORDER — POLYETHYLENE GLYCOL 3350 17 G PO PACK: 17.0000 g | PACK | Freq: Two times a day (BID) | ORAL | Status: DC

## 2015-02-11 MED ORDER — CEFAZOLIN SODIUM-DEXTROSE 2-3 GM-% IV SOLR
2.0000 g | INTRAVENOUS | Status: AC
  Administered 2015-02-11: 2 g via INTRAVENOUS

## 2015-02-11 MED ORDER — FLUTICASONE PROPIONATE 50 MCG/ACT NA SUSP
2.0000 | Freq: Every day | NASAL | Status: DC
Start: 2015-02-11 — End: 2015-02-12
  Administered 2015-02-11: 2 via NASAL
  Filled 2015-02-11: qty 16

## 2015-02-11 MED ORDER — ONDANSETRON HCL 4 MG/2ML IJ SOLN: 4.0000 mg | Freq: Once | INTRAMUSCULAR | Status: DC | PRN

## 2015-02-11 MED ORDER — BUPIVACAINE HCL (PF) 0.5 % IJ SOLN
INTRAMUSCULAR | Status: AC
  Filled 2015-02-11: qty 30

## 2015-02-11 MED ORDER — DEXAMETHASONE SODIUM PHOSPHATE 10 MG/ML IJ SOLN
10.0000 mg | Freq: Once | INTRAMUSCULAR | Status: AC
  Administered 2015-02-11: 10 mg via INTRAVENOUS

## 2015-02-11 MED ORDER — MIDAZOLAM HCL 5 MG/5ML IJ SOLN
INTRAMUSCULAR | Status: DC | PRN
  Administered 2015-02-11: 2 mg via INTRAVENOUS

## 2015-02-11 MED ORDER — PHENYLEPHRINE HCL 10 MG/ML IJ SOLN
INTRAMUSCULAR | Status: AC
  Filled 2015-02-11: qty 1

## 2015-02-11 MED ORDER — HYDROMORPHONE HCL 1 MG/ML IJ SOLN
0.5000 mg | INTRAMUSCULAR | Status: DC | PRN
  Administered 2015-02-11 (×2): 1 mg via INTRAVENOUS
  Filled 2015-02-11 (×2): qty 1

## 2015-02-11 MED ORDER — DEXAMETHASONE SODIUM PHOSPHATE 10 MG/ML IJ SOLN
10.0000 mg | Freq: Once | INTRAMUSCULAR | Status: DC
  Filled 2015-02-11: qty 1

## 2015-02-11 MED ORDER — LACTATED RINGERS IV SOLN
INTRAVENOUS | Status: DC
  Administered 2015-02-11 (×2): via INTRAVENOUS
  Administered 2015-02-11: 1000 mL via INTRAVENOUS

## 2015-02-11 MED ORDER — PHENOL 1.4 % MT LIQD
1.0000 | OROMUCOSAL | Status: DC | PRN
  Filled 2015-02-11: qty 177

## 2015-02-11 MED ORDER — CHLORHEXIDINE GLUCONATE 4 % EX LIQD: 60.0000 mL | Freq: Once | CUTANEOUS | Status: DC

## 2015-02-11 MED ORDER — AMLODIPINE BESYLATE 5 MG PO TABS
5.0000 mg | ORAL_TABLET | Freq: Every day | ORAL | Status: DC
  Administered 2015-02-11: 5 mg via ORAL
  Filled 2015-02-11 (×2): qty 1

## 2015-02-11 MED ORDER — PHENYLEPHRINE HCL 10 MG/ML IJ SOLN
10.0000 mg | INTRAVENOUS | Status: DC | PRN
  Administered 2015-02-11: 50 ug/min via INTRAVENOUS

## 2015-02-11 MED ORDER — MIDAZOLAM HCL 2 MG/2ML IJ SOLN
INTRAMUSCULAR | Status: AC
  Filled 2015-02-11: qty 2

## 2015-02-11 MED ORDER — DOCUSATE SODIUM 100 MG PO CAPS
100.0000 mg | ORAL_CAPSULE | Freq: Two times a day (BID) | ORAL | Status: DC
  Administered 2015-02-11 – 2015-02-12 (×2): 100 mg via ORAL

## 2015-02-11 MED ORDER — BISACODYL 10 MG RE SUPP: 10.0000 mg | Freq: Every day | RECTAL | Status: DC | PRN

## 2015-02-11 SURGICAL SUPPLY — 43 items
BAG DECANTER FOR FLEXI CONT (MISCELLANEOUS) ×3 IMPLANT
BAG SPEC THK2 15X12 ZIP CLS (MISCELLANEOUS)
BAG ZIPLOCK 12X15 (MISCELLANEOUS) IMPLANT
CAPT HIP TOTAL 2 ×2 IMPLANT
COVER PERINEAL POST (MISCELLANEOUS) ×3 IMPLANT
DRAPE C-ARM 42X120 X-RAY (DRAPES) ×3 IMPLANT
DRAPE STERI IOBAN 125X83 (DRAPES) ×3 IMPLANT
DRAPE U-SHAPE 47X51 STRL (DRAPES) ×9 IMPLANT
DRSG AQUACEL AG ADV 3.5X10 (GAUZE/BANDAGES/DRESSINGS) ×3 IMPLANT
DURAPREP 26ML APPLICATOR (WOUND CARE) ×3 IMPLANT
ELECT BLADE TIP CTD 4 INCH (ELECTRODE) ×3 IMPLANT
ELECT PENCIL ROCKER SW 15FT (MISCELLANEOUS) IMPLANT
ELECT REM PT RETURN 15FT ADLT (MISCELLANEOUS) IMPLANT
ELECT REM PT RETURN 9FT ADLT (ELECTROSURGICAL) ×3
ELECTRODE REM PT RTRN 9FT ADLT (ELECTROSURGICAL) ×1 IMPLANT
FACESHIELD WRAPAROUND (MASK) ×12 IMPLANT
FACESHIELD WRAPAROUND OR TEAM (MASK) ×4 IMPLANT
GLOVE BIOGEL PI IND STRL 7.5 (GLOVE) ×1 IMPLANT
GLOVE BIOGEL PI IND STRL 8.5 (GLOVE) ×1 IMPLANT
GLOVE BIOGEL PI INDICATOR 7.5 (GLOVE) ×2
GLOVE BIOGEL PI INDICATOR 8.5 (GLOVE) ×2
GLOVE ECLIPSE 8.0 STRL XLNG CF (GLOVE) ×6 IMPLANT
GLOVE ORTHO TXT STRL SZ7.5 (GLOVE) ×3 IMPLANT
GOWN SPEC L3 XXLG W/TWL (GOWN DISPOSABLE) ×3 IMPLANT
GOWN STRL REUS W/TWL LRG LVL3 (GOWN DISPOSABLE) ×3 IMPLANT
HOLDER FOLEY CATH W/STRAP (MISCELLANEOUS) ×3 IMPLANT
KIT BASIN OR (CUSTOM PROCEDURE TRAY) ×3 IMPLANT
LIQUID BAND (GAUZE/BANDAGES/DRESSINGS) ×3 IMPLANT
NDL SAFETY ECLIPSE 18X1.5 (NEEDLE) IMPLANT
NEEDLE HYPO 18GX1.5 SHARP (NEEDLE)
PACK TOTAL JOINT (CUSTOM PROCEDURE TRAY) ×3 IMPLANT
PEN SKIN MARKING BROAD (MISCELLANEOUS) ×3 IMPLANT
SAW OSC TIP CART 19.5X105X1.3 (SAW) ×3 IMPLANT
SUT MNCRL AB 4-0 PS2 18 (SUTURE) ×3 IMPLANT
SUT VIC AB 1 CT1 36 (SUTURE) ×9 IMPLANT
SUT VIC AB 2-0 CT1 27 (SUTURE) ×6
SUT VIC AB 2-0 CT1 TAPERPNT 27 (SUTURE) ×2 IMPLANT
SUT VLOC 180 0 24IN GS25 (SUTURE) ×3 IMPLANT
SYR 50ML LL SCALE MARK (SYRINGE) IMPLANT
TOWEL OR 17X26 10 PK STRL BLUE (TOWEL DISPOSABLE) ×3 IMPLANT
TOWEL OR NON WOVEN STRL DISP B (DISPOSABLE) IMPLANT
TRAY FOLEY CATH 14FRSI W/METER (CATHETERS) ×3 IMPLANT
WATER STERILE IRR 1500ML POUR (IV SOLUTION) ×3 IMPLANT

## 2015-02-11 NOTE — Anesthesia Postprocedure Evaluation (Signed)
  Anesthesia Post-op Note  Patient: Charles SakaiRobert L [name]  Procedure(s) Performed: Procedure(s): LEFT TOTAL HIP ARTHROPLASTY ANTERIOR APPROACH (Left)  Patient Location: PACU  Anesthesia Type:Spinal  Level of Consciousness: awake, alert , oriented and patient cooperative  Airway and Oxygen Therapy: Patient Spontanous Breathing  Post-op Pain: mild  Post-op Assessment: Post-op Vital signs reviewed, Patient's Cardiovascular Status Stable, Respiratory Function Stable, Patent Airway, No signs of Nausea or vomiting and Pain level controlled  Post-op Vital Signs: stable  Last Vitals:  Filed Vitals:   02/11/15 1412  BP: 122/80  Pulse: 60  Temp: 36.6 C  Resp: 14    Complications: No apparent anesthesia complications

## 2015-02-11 NOTE — Anesthesia Preprocedure Evaluation (Signed)
Anesthesia Evaluation  Patient identified by MRN, date of birth, ID band Patient awake    Reviewed: Allergy & Precautions, NPO status , Patient's Chart, lab work & pertinent test results  Airway        Dental   Pulmonary former smoker,          Cardiovascular hypertension,     Neuro/Psych    GI/Hepatic hiatal hernia, GERD-  ,  Endo/Other    Renal/GU      Musculoskeletal  (+) Arthritis -,   Abdominal   Peds  Hematology   Anesthesia Other Findings   Reproductive/Obstetrics                             Anesthesia Physical Anesthesia Plan  ASA: II  Anesthesia Plan: Spinal   Post-op Pain Management:    Induction: Intravenous  Airway Management Planned: Simple Face Mask  Additional Equipment:   Intra-op Plan:   Post-operative Plan:   Informed Consent: I have reviewed the patients History and Physical, chart, labs and discussed the procedure including the risks, benefits and alternatives for the proposed anesthesia with the patient or authorized representative who has indicated his/her understanding and acceptance.     Plan Discussed with: CRNA, Anesthesiologist and Surgeon  Anesthesia Plan Comments:         Anesthesia Quick Evaluation

## 2015-02-11 NOTE — Interval H&P Note (Signed)
History and Physical Interval Note:  02/11/2015 10:28 AM  Charles [name]  has presented today for surgery, with the diagnosis of left hip osteoarthritis  The various methods of treatment have been discussed with the patient and family. After consideration of risks, benefits and other options for treatment, the patient has consented to  Procedure(s): LEFT TOTAL HIP ARTHROPLASTY ANTERIOR APPROACH (Left) as a surgical intervention .  The patient's history has been reviewed, patient examined, no change in status, stable for surgery.  I have reviewed the patient's chart and labs.  Questions were answered to the patient's satisfaction.     Shelda PalLIN,Kerriann Kamphuis D

## 2015-02-11 NOTE — Evaluation (Signed)
Physical Therapy Evaluation Patient Details Name: Charles SakaiRobert L [name] MRN: [id number] DOB: 04/23/1951 Today's Date: 02/11/2015   History of Present Illness  L  DAATHA  Clinical Impression  Patient tolerated well , ambulated x 70'. Patient will benefit from PT to address problems listed in note below.    Follow Up Recommendations Home health PT;Supervision/Assistance - 24 hour    Equipment Recommendations  Rolling walker with 5" wheels    Recommendations for Other Services       Precautions / Restrictions Precautions Precautions: Fall Restrictions Weight Bearing Restrictions: No      Mobility  Bed Mobility Overal bed mobility: Needs Assistance Bed Mobility: Supine to Sit;Sit to Supine     Supine to sit: Min assist;HOB elevated Sit to supine: Min assist;Mod assist   General bed mobility comments: assist for moving LLE to edge of bed, cues for technique. assist with LLE onto bed.  Transfers Overall transfer level: Needs assistance Equipment used: Rolling walker (2 wheeled) Transfers: Sit to/from Stand Sit to Stand: Min assist;From elevated surface         General transfer comment: cues for hand and L leg popsition  Ambulation/Gait Ambulation/Gait assistance: Min assist Ambulation Distance (Feet): 70 Feet Assistive device: Rolling walker (2 wheeled) Gait Pattern/deviations: Step-to pattern;Step-through pattern     General Gait Details: cues for sequence and position inside of RW. able to advance LLE first  Stairs            Wheelchair Mobility    Modified Rankin (Stroke Patients Only)       Balance                                             Pertinent Vitals/Pain Pain Assessment: 0-10 Pain Score: 3  Pain Descriptors / Indicators: Discomfort;Sore Pain Intervention(s): Limited activity within patient's tolerance;Premedicated before session;Ice applied;Repositioned    Home Living Family/patient expects to be discharged to::  Private residence Living Arrangements: Spouse/significant other Available Help at Discharge: Family;Available 24 hours/day Type of Home: House Home Access: Stairs to enter Entrance Stairs-Rails: None Entrance Stairs-Number of Steps: 3 Home Layout: Two level;Full bath on main level Home Equipment: Cane - single point      Prior Function Level of Independence: Independent               Hand Dominance        Extremity/Trunk Assessment               Lower Extremity Assessment: LLE deficits/detail   LLE Deficits / Details: requires assist to flex Hip in supine, able to advance Leg during ambulation.     Communication   Communication: No difficulties  Cognition Arousal/Alertness: Awake/alert Behavior During Therapy: WFL for tasks assessed/performed Overall Cognitive Status: Within Functional Limits for tasks assessed                      General Comments      Exercises Total Joint Exercises Long Arc Quad: AROM;Left;5 reps;Seated      Assessment/Plan    PT Assessment    PT Diagnosis Difficulty walking   PT Problem List    PT Treatment Interventions     PT Goals (Current goals can be found in the Care Plan section) Acute Rehab PT Goals Patient Stated Goal: to walk without pain on the L. PT Goal Formulation: With patient Time For  Goal Achievement: 02/14/15 Potential to Achieve Goals: Good    Frequency     Barriers to discharge        Co-evaluation               End of Session   Activity Tolerance: Patient tolerated treatment well Patient left: in bed;with call bell/phone within reach Nurse Communication: Mobility status         Time: 9604-5409 PT Time Calculation (min) (ACUTE ONLY): 21 min   Charges:   PT Evaluation $Initial PT Evaluation Tier I: 1 Procedure     PT G CodesRada Hay 02/11/2015, 5:47 PM

## 2015-02-11 NOTE — Anesthesia Procedure Notes (Signed)
Spinal Patient location during procedure: OR End time: 02/11/2015 11:33 AM Staffing Resident/CRNA: Noralyn Pick Performed by: anesthesiologist and resident/CRNA  Preanesthetic Checklist Completed: patient identified, site marked, surgical consent, pre-op evaluation, timeout performed, IV checked, risks and benefits discussed and monitors and equipment checked Spinal Block Patient position: sitting Prep: Betadine Patient monitoring: heart rate, continuous pulse ox and blood pressure Approach: midline Location: L2-3 Injection technique: single-shot Needle Needle type: Sprotte and Pencil-Tip  Needle gauge: 24 G Needle length: 9 cm Additional Notes Expiration date of kit checked and confirmed. Patient tolerated procedure well, without complications.

## 2015-02-11 NOTE — Transfer of Care (Signed)
Immediate Anesthesia Transfer of Care Note  Patient: Charles [name]  Procedure(s) Performed: Procedure(s): LEFT TOTAL HIP ARTHROPLASTY ANTERIOR APPROACH (Left)  Patient Location: PACU  Anesthesia Type:Spinal  Level of Consciousness: awake, alert  and oriented  Airway & Oxygen Therapy: Patient Spontanous Breathing and Patient connected to face mask oxygen  Post-op Assessment: Report given to RN and Post -op Vital signs reviewed and stable  Post vital signs: Reviewed and stable  Last Vitals:  Filed Vitals:   02/11/15 0830  BP: 146/89  Pulse: 77  Temp: 36.6 C  Resp: 18    Complications: No apparent anesthesia complications

## 2015-02-12 LAB — CBC
HCT: 40.1 % (ref 39.0–52.0)
Hemoglobin: 13.4 g/dL (ref 13.0–17.0)
MCH: 30.9 pg (ref 26.0–34.0)
MCHC: 33.4 g/dL (ref 30.0–36.0)
MCV: 92.4 fL (ref 78.0–100.0)
PLATELETS: 185 10*3/uL (ref 150–400)
RBC: 4.34 MIL/uL (ref 4.22–5.81)
RDW: 12.1 % (ref 11.5–15.5)
WBC: 8.5 10*3/uL (ref 4.0–10.5)

## 2015-02-12 LAB — BASIC METABOLIC PANEL
Anion gap: 8 (ref 5–15)
BUN: 16 mg/dL (ref 6–23)
CO2: 27 mmol/L (ref 19–32)
Calcium: 8.5 mg/dL (ref 8.4–10.5)
Chloride: 102 mmol/L (ref 96–112)
Creatinine, Ser: 1.05 mg/dL (ref 0.50–1.35)
GFR calc Af Amer: 85 mL/min — ABNORMAL LOW (ref >90)
GFR, EST NON AFRICAN AMERICAN: 74 mL/min — AB (ref >90)
Glucose, Bld: 139 mg/dL — ABNORMAL HIGH (ref 70–99)
POTASSIUM: 4.6 mmol/L (ref 3.5–5.1)
Sodium: 137 mmol/L (ref 135–145)

## 2015-02-12 MED ORDER — ASPIRIN 325 MG PO TBEC: 325.0000 mg | DELAYED_RELEASE_TABLET | Freq: Two times a day (BID) | ORAL | Status: AC

## 2015-02-12 MED ORDER — METHOCARBAMOL 500 MG PO TABS: 500.0000 mg | ORAL_TABLET | Freq: Four times a day (QID) | ORAL | Status: DC | PRN

## 2015-02-12 MED ORDER — FERROUS SULFATE 325 (65 FE) MG PO TABS: 325.0000 mg | ORAL_TABLET | Freq: Three times a day (TID) | ORAL | Status: DC

## 2015-02-12 MED ORDER — DOCUSATE SODIUM 100 MG PO CAPS: 100.0000 mg | ORAL_CAPSULE | Freq: Two times a day (BID) | ORAL | Status: DC

## 2015-02-12 MED ORDER — POLYETHYLENE GLYCOL 3350 17 G PO PACK: 17.0000 g | PACK | Freq: Two times a day (BID) | ORAL | Status: DC

## 2015-02-12 MED ORDER — HYDROCODONE-ACETAMINOPHEN 7.5-325 MG PO TABS: 1.0000 | ORAL_TABLET | ORAL | Status: DC | PRN

## 2015-02-12 NOTE — Progress Notes (Signed)
RN reviewed discharge instructions with patient and family. All questions answered.  Paperwork and prescriptions given.   NT rolled patient down in wheelchair to family car.  

## 2015-02-12 NOTE — Progress Notes (Signed)
Physical Therapy Treatment Patient Details Name: DELTON STELLE MRN: [id number] DOB: February 10, 1951 Today's Date: 02/12/2015    History of Present Illness L  DA THA    PT Comments    Patient is feeling  Well with min pain. Ready for DC   Follow Up Recommendations  Home health PT;Supervision/Assistance - 24 hour     Equipment Recommendations  Rolling walker with 5" wheels    Recommendations for Other Services       Precautions / Restrictions Precautions Precautions: Fall Restrictions Weight Bearing Restrictions: No    Mobility  Bed Mobility   Bed Mobility: Supine to Sit;Sit to Supine     Supine to sit: Modified independent (Device/Increase time) Sit to supine: Modified independent (Device/Increase time)      Transfers   Equipment used: Rolling walker (2 wheeled) Transfers: Sit to/from Stand Sit to Stand: Modified independent (Device/Increase time)         General transfer comment: cues for scooting forward and UE/LE placement  Ambulation/Gait Ambulation/Gait assistance: Supervision Ambulation Distance (Feet): 400 Feet Assistive device: Rolling walker (2 wheeled) Gait Pattern/deviations: Step-through pattern     General Gait Details: cues for sequence and position inside of RW. able to advance LLE first   Stairs Stairs: Yes Stairs assistance: Supervision Stair Management: One rail Right;Step to pattern;Backwards;Forwards;With walker Number of Stairs: 2 General stair comments: cues for safety and sequemce, technique  Wheelchair Mobility    Modified Rankin (Stroke Patients Only)       Balance                                    Cognition Arousal/Alertness: Awake/alert Behavior During Therapy: WFL for tasks assessed/performed Overall Cognitive Status: Within Functional Limits for tasks assessed                      Exercises Total Joint Exercises Ankle Circles/Pumps: AROM;Both;10 reps;Supine Quad Sets: AROM;Both;10  reps;Supine Short Arc Quad: AROM;Left;10 reps;Supine Heel Slides: AROM;Left;10 reps;Supine Hip ABduction/ADduction: AROM;Left;10 reps;Supine Straight Leg Raises: AROM;Left;10 reps;Supine Long Arc Quad: AROM;Left;10 reps;Seated    General Comments        Pertinent Vitals/Pain Pain Score: 1  Pain Location: l  hip Pain Descriptors / Indicators: Sore Pain Intervention(s): Premedicated before session;Ice applied    Home Living Family/patient expects to be discharged to:: Private residence Living Arrangements: Spouse/significant other Available Help at Discharge: Family;Available 24 hours/day         Home Equipment: Kasandra Knudsen - single point (bought AE kit)      Prior Function Level of Independence: Independent          PT Goals (current goals can now be found in the care plan section) Progress towards PT goals: Progressing toward goals    Frequency  7X/week    PT Plan Current plan remains appropriate    Co-evaluation             End of Session   Activity Tolerance: Patient tolerated treatment well Patient left: in chair;with call bell/phone within reach     Time: 1015-1042 PT Time Calculation (min) (ACUTE ONLY): 27 min  Charges:  $Gait Training: 8-22 mins $Therapeutic Exercise: 8-22 mins                    G Codes:      Claretha Cooper 02/12/2015, 12:55 PM Tresa Endo PT [phone]

## 2015-02-12 NOTE — Plan of Care (Signed)
Problem: Phase III Progression Outcomes Goal: Anticoagulant follow-up in place Outcome: Not Applicable Date Met:  83/81/84 ASA for VTE, no f/u needed.  Problem: Consults Goal: Diagnosis- Total Joint Replacement Outcome: Completed/Met Date Met:  02/12/15 Primary Total Hip LEFT, Anterior

## 2015-02-12 NOTE — Care Management Note (Signed)
    Page 1 of 2   02/12/2015     12:34:41 PM CARE MANAGEMENT NOTE 02/12/2015  Patient:  Charles [name], Charles [name]   Account Number:  [id number]  Date Initiated:  02/12/2015  Documentation initiated by:  [facility]  Subjective/Objective Assessment:   NRW:CHJS TOTAL HIP ARTHROPLASTY ANTERIOR APPROACH (Left)     Action/Plan:   discharge planning   Anticipated DC Date:  02/12/2015   Anticipated DC Plan:  [place]  CM consult      [facility] Choice  HOME HEALTH   Choice offered to / List presented to:  [name]   DME arranged  3-N-1  Vassie Moselle      DME agency  [place] arranged  [place]   Status of service:  Completed, signed off Medicare Important Message given?   (If response is "NO", the following Medicare IM given date fields will be blank) Date Medicare IM given:   Medicare IM given by:   Date Additional Medicare IM given:   Additional Medicare IM given by:    Discharge Disposition:  [place]  Per UR Regulation:  Reviewed for med. necessity/level of care/duration of stay  If discussed at [place] of Stay Meetings, dates discussed:    Comments:  02/12/15 08:30 Cm met with pt in room to offer choice of home health agency.  Pt chooses [facility] to render HHPT/OT. Address and contact information verified with pt.  CM called [facility] DME rep, Lecretia to please deliver the rolling walker and 3n1 to room prior to discharge.  Referral emailed to [employer], Tim.  No other CM needs were communicated.  Mariane Masters, BSN, CM [phone].

## 2015-02-12 NOTE — Progress Notes (Signed)
     Subjective: 1 Day Post-Op Procedure(s) (LRB): LEFT TOTAL HIP ARTHROPLASTY ANTERIOR APPROACH (Left)   Patient reports pain as mild, pain controlled. No events throughout the night.  Worked some with PT yesterday, and feels pretty good.  Objective:   VITALS:   Filed Vitals:   02/12/15 0443  BP: 140/91  Pulse: 58  Temp: 98.3 F (36.8 C)  Resp: 18    Dorsiflexion/Plantar flexion intact Incision: dressing C/D/I No cellulitis present Compartment soft  LABS  Recent Labs  02/12/15 0539  HGB 13.4  HCT 40.1  WBC 8.5  PLT 185     Recent Labs  02/12/15 0539  NA 137  K 4.6  BUN 16  CREATININE 1.05  GLUCOSE 139*     Assessment/Plan: 1 Day Post-Op Procedure(s) (LRB): LEFT TOTAL HIP ARTHROPLASTY ANTERIOR APPROACH (Left) Foley cath d/c'ed Advance diet Up with therapy D/C IV fluids Discharge home with home health  [facility]. Follow up with OLIN,Tarin Navarez D in 2 weeks.  Contact information:  [facility] [address] [place] [id number] [phone]        Anastasio AuerbachMatthew S. Salima Rumer   PAC  02/12/2015, 9:42 AM

## 2015-02-12 NOTE — Evaluation (Signed)
Occupational Therapy Evaluation Patient Details Name: BRAILEN MACNEAL MRN: [id number] DOB: 11-Jun-1951 Today's Date: 02/12/2015    History of Present Illness L  DA THA   Clinical Impression   This 64 year old man was admitted for the above surgery.  All education was completed.  No further OT is needed at this time.      Follow Up Recommendations  No OT follow up    Equipment Recommendations  None recommended by OT (Pt will get shower seat himself)    Recommendations for Other Services       Precautions / Restrictions Precautions Precautions: Fall Restrictions Weight Bearing Restrictions: No      Mobility Bed Mobility                  Transfers   Equipment used: Rolling walker (2 wheeled) Transfers: Sit to/from Stand Sit to Stand: Supervision         General transfer comment: cues for scooting forward and UE/LE placement    Balance                                            ADL Overall ADL's : Needs assistance/impaired             Lower Body Bathing: Supervison/ safety;With adaptive equipment;Sit to/from stand       Lower Body Dressing: Supervision/safety;Sit to/from stand;With adaptive equipment   Toilet Transfer: Supervision/safety;Comfort height toilet;RW       Tub/ Shower Transfer: Supervision/safety;Walk-in shower;Ambulation;Rolling walker     General ADL Comments: Worked with AE for LB dressing and practiced toilet and shower transfers.  Pt plans to buy a shower seat:  resources and pictures provided.     Vision     Perception     Praxis      Pertinent Vitals/Pain Pain Score: 2  Pain Location: L hip Pain Descriptors / Indicators: Sore Pain Intervention(s): Limited activity within patient's tolerance;Monitored during session;Premedicated before session;Repositioned;Ice applied     Hand Dominance     Extremity/Trunk Assessment Upper Extremity Assessment Upper Extremity Assessment: Overall WFL for  tasks assessed           Communication Communication Communication: No difficulties   Cognition Arousal/Alertness: Awake/alert Behavior During Therapy: WFL for tasks assessed/performed Overall Cognitive Status: Within Functional Limits for tasks assessed                     General Comments       Exercises       Shoulder Instructions      Home Living Family/patient expects to be discharged to:: Private residence Living Arrangements: Spouse/significant other Available Help at Discharge: Family;Available 24 hours/day               Bathroom Shower/Tub: Occupational psychologist: Handicapped height     Home Equipment: Radio producer - single point (bought AE kit)          Prior Functioning/Environment Level of Independence: Independent             OT Diagnosis: Generalized weakness   OT Problem List:     OT Treatment/Interventions:      OT Goals(Current goals can be found in the care plan section)    OT Frequency:     Barriers to D/C:            Co-evaluation  End of Session    Activity Tolerance: Patient tolerated treatment well Patient left: in chair;with call bell/phone within reach   Time: 0928-0950 OT Time Calculation (min): 22 min Charges:  OT General Charges $OT Visit: 1 Procedure OT Evaluation $Initial OT Evaluation Tier I: 1 Procedure G-Codes:    Kana Reimann 02-25-15, 10:07 AM  Lesle Chris, OTR/L [phone] 2015/02/25
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
## 2015-02-19 NOTE — Op Note (Signed)
NAME:  Charles SakaiRobert L [name]                ACCOUNT NO.: [id number]      MEDICAL RECORD NO.: [id number]      FACILITY[facility]      PHYSICIAN:  Durene RomansLIN,Rease Wence D  DATE OF BIRTH:  1951-01-11     DATE OF PROCEDURE:  02/11/2015                                 OPERATIVE REPORT         PREOPERATIVE DIAGNOSIS: Left  hip osteoarthritis.      POSTOPERATIVE DIAGNOSIS:  Left hip osteoarthritis.      PROCEDURE:  Left total hip replacement through an anterior approach   utilizing DePuy THR system, component size 56mm pinnacle cup, a size 36+4 neutral   Altrex liner, a size 6Hi Tri Lock stem with a 36+1.5 delta ceramic   ball.      SURGEON:  Madlyn FrankelMatthew D. Charlann Boxerlin, M.D.      ASSISTANT:  Lanney GinsMatthew Babish, PA      ANESTHESIA:  Spinal.      SPECIMENS:  None.      COMPLICATIONS:  None.      BLOOD LOSS:  300 cc     DRAINS:  None.      INDICATION OF THE PROCEDURE:  Charles [name] is a 64 y.o. male who had   presented to office for evaluation of left hip pain.  Radiographs revealed   progressive degenerative changes with bone-on-bone   articulation to the  hip joint.  The patient had painful limited range of   motion significantly affecting their overall quality of life.  The patient was failing to    respond to conservative measures, and at this point was ready   to proceed with more definitive measures.  The patient has noted progressive   degenerative changes in his hip, progressive problems and dysfunction   with regarding the hip prior to surgery.  Consent was obtained for   benefit of pain relief.  Specific risk of infection, DVT, component   failure, dislocation, need for revision surgery, as well discussion of   the anterior versus posterior approach were reviewed.  Consent was   obtained for benefit of anterior pain relief through an anterior   approach.      PROCEDURE IN DETAIL:  The patient was brought to operative theater.   Once adequate anesthesia, preoperative  antibiotics, 2gm of Ancef, 1gm of Tranexamic Acid, and 10mg  of Decadron administered.   The patient was positioned supine on the OSI Hanna table.  Once adequate   padding of boney process was carried out, we had predraped out the hip, and  used fluoroscopy to confirm orientation of the pelvis and position.      The left hip was then prepped and draped from proximal iliac crest to   mid thigh with shower curtain technique.      Time-out was performed identifying the patient, planned procedure, and   extremity.     An incision was then made 2 cm distal and lateral to the   anterior superior iliac spine extending over the orientation of the   tensor fascia lata muscle and sharp dissection was carried down to the   fascia of the muscle and protractor placed in the soft tissues.      The fascia was then incised.  The muscle belly was identified and swept   laterally and retractor placed along the superior neck.  Following   cauterization of the circumflex vessels and removing some pericapsular   fat, a second cobra retractor was placed on the inferior neck.  A third   retractor was placed on the anterior acetabulum after elevating the   anterior rectus.  A L-capsulotomy was along the line of the   superior neck to the trochanteric fossa, then extended proximally and   distally.  Tag sutures were placed and the retractors were then placed   intracapsular.  We then identified the trochanteric fossa and   orientation of my neck cut, confirmed this radiographically   and then made a neck osteotomy with the femur on traction.  The femoral   head was removed without difficulty or complication.  Traction was let   off and retractors were placed posterior and anterior around the   acetabulum.      The labrum and foveal tissue were debrided.  I began reaming with a 49mm   reamer and reamed up to 55mm reamer with good bony bed preparation and a 56mm   cup was chosen.  The final 56mm Pinnacle cup  was then impacted under fluoroscopy  to confirm the depth of penetration and orientation with respect to   abduction.  A screw was placed followed by the hole eliminator.  The final   36+4 neutral Altrex liner was impacted with good visualized rim fit.  The cup was positioned anatomically within the acetabular portion of the pelvis.      At this point, the femur was rolled at 80 degrees.  Further capsule was   released off the inferior aspect of the femoral neck.  I then   released the superior capsule proximally.  The hook was placed laterally   along the femur and elevated manually and held in position with the bed   hook.  The leg was then extended and adducted with the leg rolled to 100   degrees of external rotation.  Once the proximal femur was fully   exposed, I used a box osteotome to set orientation.  I then began   broaching with the starting chili pepper broach and passed this by hand and then broached up to 6.  With the 6 broach in place I chose a high offset neck and did a trial reduction.  The offset was appropriate, leg lengths   appeared to be equal, confirmed radiographically.   Given these findings, I went ahead and dislocated the hip, repositioned all   retractors and positioned the right hip in the extended and abducted position.  The final 6 Hi Tri Lock stem was   chosen and it was impacted down to the level of neck cut.  Based on this   and the trial reduction, a 36+1.5 delta ceramic ball was chosen and   impacted onto a clean and dry trunnion, and the hip was reduced.  The   hip had been irrigated throughout the case again at this point.  I did   reapproximate the superior capsular leaflet to the anterior leaflet   using #1 Vicryl.  The fascia of the   tensor fascia lata muscle was then reapproximated using #1 Vicryl and #0 V-lock sutures.  The   remaining wound was closed with 2-0 Vicryl and running 4-0 Monocryl.   The hip was cleaned, dried, and dressed sterilely  using Dermabond and   Aquacel dressing.  He was then brought   to recovery room in stable condition tolerating the procedure well.    Lanney Gins, PA-C was present for the entirety of the case involved from   preoperative positioning, perioperative retractor management, general   facilitation of the case, as well as primary wound closure as assistant.            Madlyn Frankel Charlann Boxer, M.D.        02/19/2015 8:48 PM

## 2016-01-30 IMAGING — DX DG HIP (WITH OR WITHOUT PELVIS) 1V PORT*L*
2 series · 2 of 2 positions shown · non-contrast
Comparison: None.

CLINICAL DATA: Status post left hip arthroplasty.

EXAM:
LEFT HIP (WITH PELVIS) 1 VIEW PORTABLE

[hip x-table]
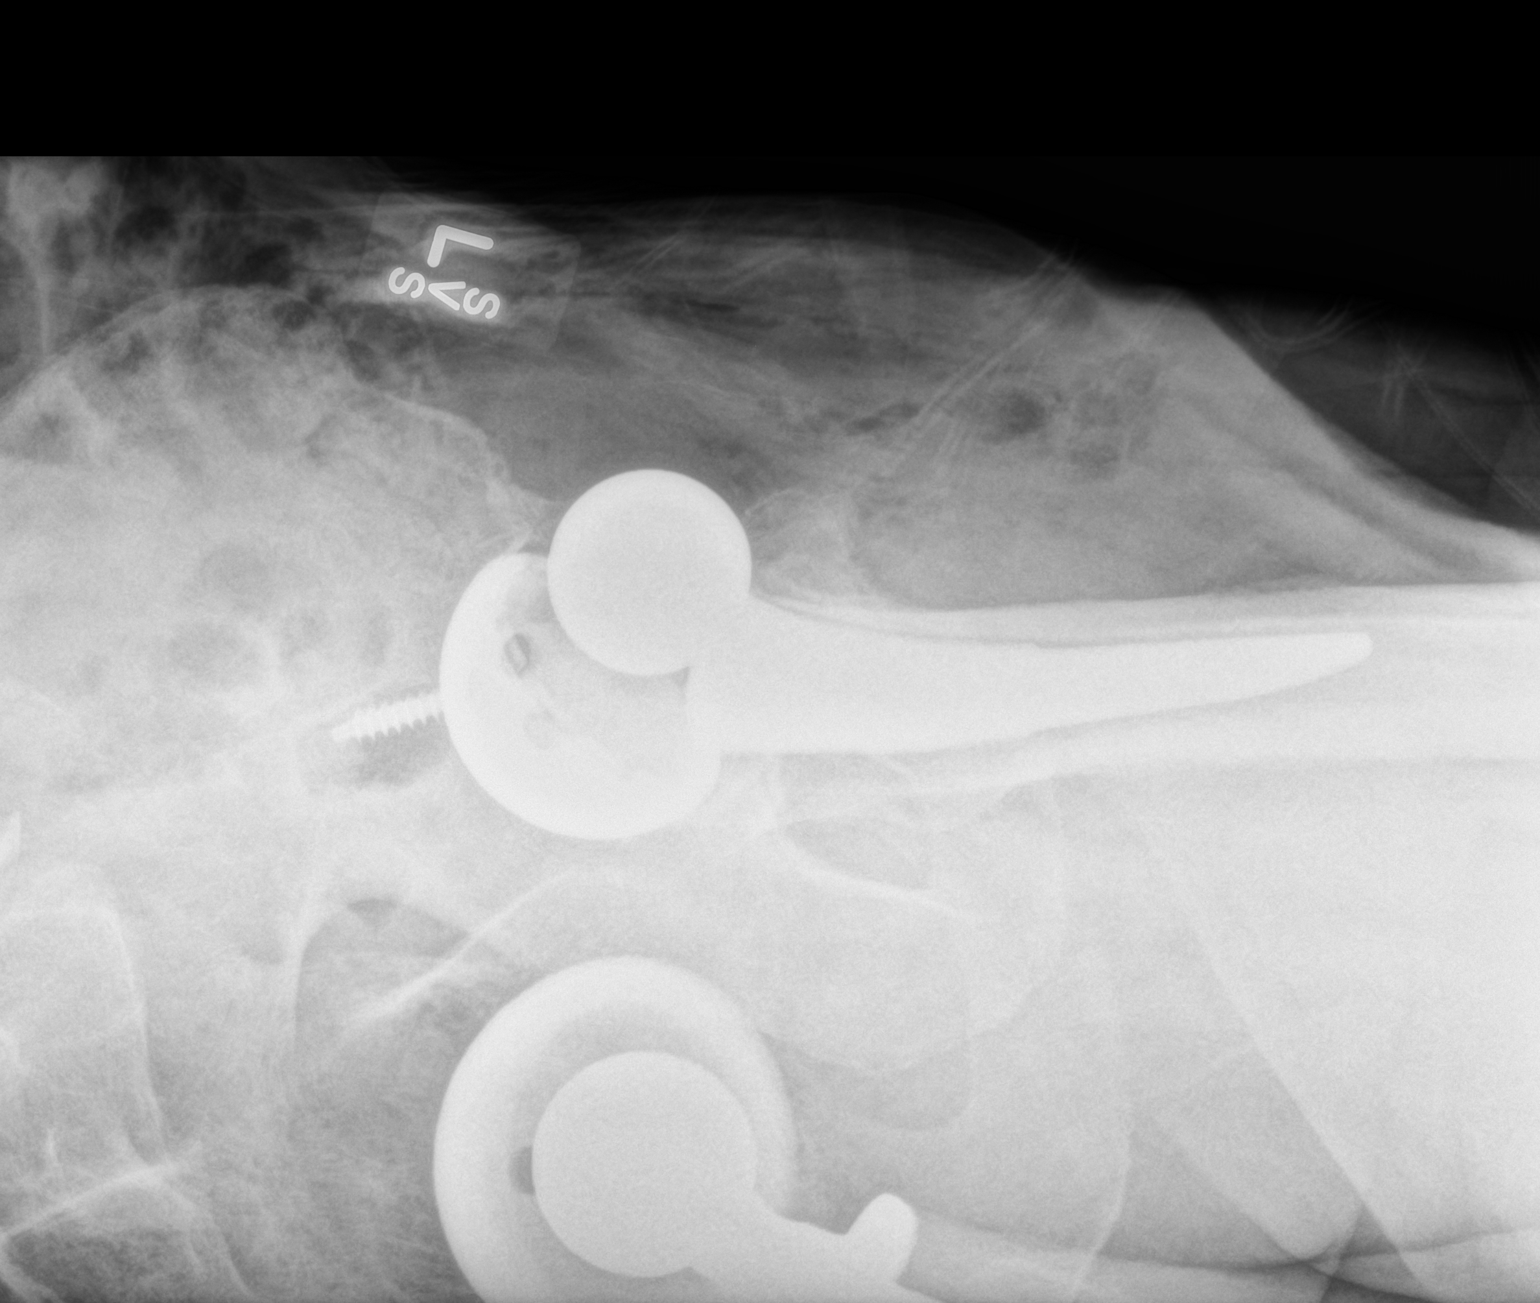

[pelvis ap]
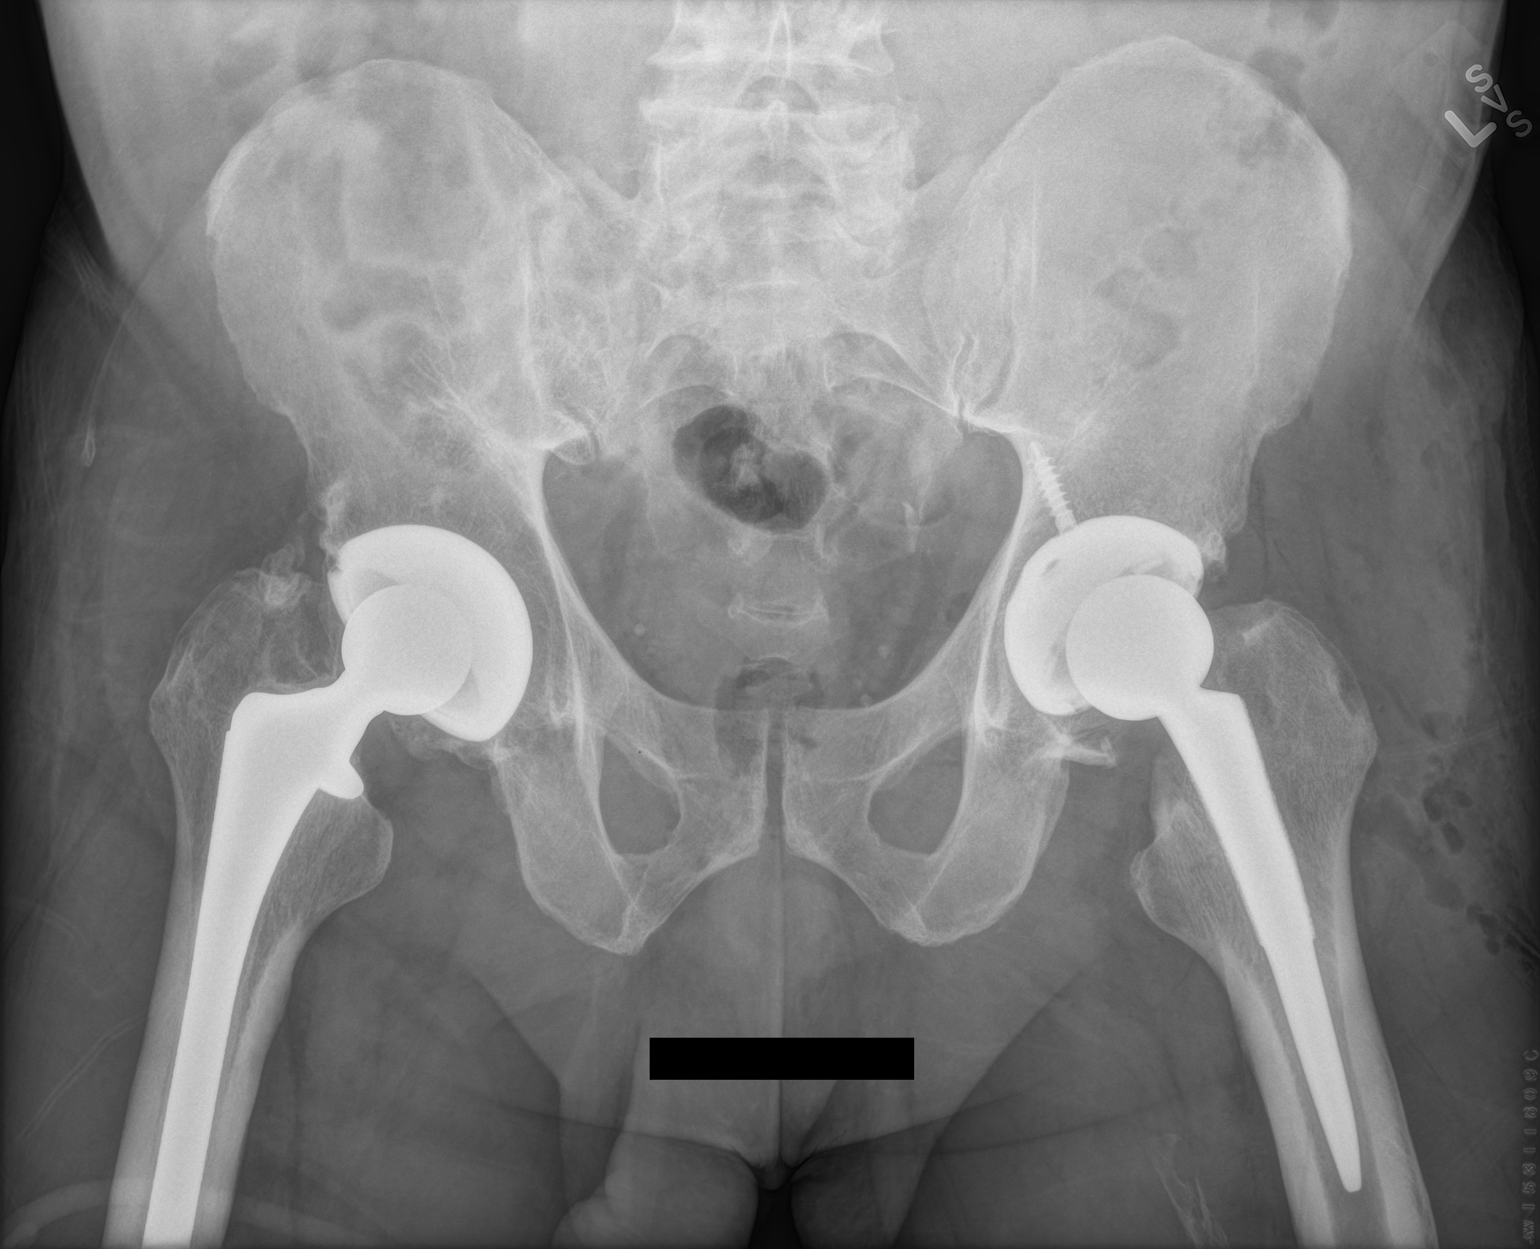

[2 of 2 positions shown; findings below may reference images not displayed]

FINDINGS: Left hip arthroplasty. No periprosthetic fracture other acute
hardware complication. The femoral component projects mildly
anterior on the lateral view. This is favored to be
projectional/positional. No evidence of dislocation on the AP view.
Right hip arthroplasty incidentally noted.
IMPRESSION: Expected appearance after left hip arthroplasty.

## 2016-11-30 DIAGNOSIS — H02409 Unspecified ptosis of unspecified eyelid: Secondary | ICD-10-CM | POA: Insufficient documentation

## 2017-07-26 ENCOUNTER — Ambulatory Visit (INDEPENDENT_AMBULATORY_CARE_PROVIDER_SITE_OTHER): Payer: Medicare Other | Admitting: Podiatry

## 2017-07-26 ENCOUNTER — Encounter: Payer: Self-pay | Admitting: Podiatry

## 2017-07-26 ENCOUNTER — Ambulatory Visit (INDEPENDENT_AMBULATORY_CARE_PROVIDER_SITE_OTHER): Payer: Medicare Other

## 2017-07-26 DIAGNOSIS — M779 Enthesopathy, unspecified: Principal | ICD-10-CM

## 2017-07-26 DIAGNOSIS — M778 Other enthesopathies, not elsewhere classified: Secondary | ICD-10-CM

## 2017-07-26 DIAGNOSIS — M7751 Other enthesopathy of right foot: Secondary | ICD-10-CM | POA: Diagnosis not present

## 2017-07-26 DIAGNOSIS — M79671 Pain in right foot: Secondary | ICD-10-CM

## 2017-07-26 MED ORDER — MELOXICAM 15 MG PO TABS: 15.0000 mg | ORAL_TABLET | Freq: Every day | ORAL | 0 refills | Status: DC
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
## 2017-08-10 ENCOUNTER — Encounter: Payer: Self-pay | Admitting: Podiatry

## 2017-08-10 ENCOUNTER — Ambulatory Visit (INDEPENDENT_AMBULATORY_CARE_PROVIDER_SITE_OTHER): Payer: Medicare Other | Admitting: Podiatry

## 2017-08-10 VITALS — BP 140/87 | HR 71 | Resp 18

## 2017-08-10 DIAGNOSIS — M779 Enthesopathy, unspecified: Secondary | ICD-10-CM

## 2017-08-10 MED ORDER — TRIAMCINOLONE ACETONIDE 10 MG/ML IJ SUSP
2.5000 mg | Freq: Once | INTRAMUSCULAR | Status: AC
  Administered 2017-08-10: 2.5 mg via INTRA_ARTICULAR

## 2017-08-10 MED ORDER — DEXAMETHASONE SODIUM PHOSPHATE 120 MG/30ML IJ SOLN
2.0000 mg | Freq: Once | INTRAMUSCULAR | Status: AC
  Administered 2017-08-10: 2 mg via INTRA_ARTICULAR

## 2017-08-10 NOTE — Progress Notes (Signed)
   Subjective:    Patient ID: Isaias Sakaiobert L Peerson, male    DOB: 1951/01/14, 66 y.o.   MRN: [id number]  HPI 66 y.o. male presents for f/u of R foot pain. Has been wearing his powerstep inserts. States that ~4 days ago he called for an appointment because his foot was not getting better, but states since then his pain has improved. States pain was formerly 6/7 at last visit, now 4/5. Would like to receive the injection discussed at his last visit.  Review of Systems     Objective:   Physical Exam Vitals:   08/10/17 1452  BP: 140/87  Pulse: 71  Resp: 18   General AA&O x3. Normal mood and affect.  Vascular Dorsalis pedis and posterior tibial pulses  present 2+ bilaterally   Neurologic Epicritic sensation grossly present.  Dermatologic No open lesions. Skin normal temperature and turgor.  Orthopedic: POP 5th metatarsal base plantarly and laterally.     Assessment & Plan:  Capsulitis, R -Injection delivered as below. -Continue Powersteps.  Procedure: Joint Injection Location: Right 5th TMT joint capsule Skin Prep: Alcohol. Injectate: 0.5 cc 1% lidocaine plain, 0.5 cc dexamethasone phosphate, 0.25 cc Kenalog 10. Disposition: Patient tolerated procedure well. Injection site dressed with a band-aid.  Return if symptoms worsen or fail to improve.

## 2017-09-14 ENCOUNTER — Other Ambulatory Visit: Payer: Self-pay | Admitting: Internal Medicine

## 2017-09-14 DIAGNOSIS — R1011 Right upper quadrant pain: Secondary | ICD-10-CM

## 2017-09-15 ENCOUNTER — Ambulatory Visit
Admission: RE | Admit: 2017-09-15 | Discharge: 2017-09-15 | Disposition: A | Discharge status: Home / Self Care | Payer: Medicare Other | Source: Ambulatory Visit | Attending: Internal Medicine | Admitting: Internal Medicine

## 2017-09-15 DIAGNOSIS — R1011 Right upper quadrant pain: Secondary | ICD-10-CM

## 2017-10-13 ENCOUNTER — Ambulatory Visit (INDEPENDENT_AMBULATORY_CARE_PROVIDER_SITE_OTHER): Payer: Medicare Other | Admitting: Podiatry

## 2017-10-13 ENCOUNTER — Encounter: Payer: Self-pay | Admitting: Podiatry

## 2017-10-13 ENCOUNTER — Ambulatory Visit: Payer: Medicare Other

## 2017-10-13 DIAGNOSIS — M659 Synovitis and tenosynovitis, unspecified: Secondary | ICD-10-CM

## 2017-10-13 DIAGNOSIS — M779 Enthesopathy, unspecified: Principal | ICD-10-CM

## 2017-10-13 DIAGNOSIS — M778 Other enthesopathies, not elsewhere classified: Secondary | ICD-10-CM

## 2017-10-13 NOTE — Progress Notes (Signed)
  Subjective:  Patient ID: Charles SakaiRobert L Fairburn, male    DOB: Apr 06, 1951,  MRN: [id number]  Chief Complaint  Patient presents with  . Foot Pain    right, lateral side of foot painful x 4-5 days.    66 y.o. male returns for the above complaint.  States that the injection has lasted up until 4-5 days ago.  Has been wearing his power step orthotics with a pad underneath his fifth metatarsal base.  No other new issues  Objective:  There were no vitals filed for this visit.  General AA&O x3. Normal mood and affect.  Vascular Pedal pulses palpable.  Neurologic Epicritic sensation grossly intact.  Dermatologic No open lesions. Skin normal texture and turgor.  Orthopedic: Pain to palpation right fifth metatarsal base joint capsule    Assessment & Plan:  Patient was evaluated and treated and all questions answered.  Enthesitis fifth metatarsal base -Injection delivered as below -Additional padding applied to patient's orthotic  Procedure: Joint Injection Location: Right 5th metatarsal base Skin Prep: Alcohol. Injectate: 0.5 cc 1% lidocaine plain, 0.5 cc dexamethasone phosphate. Disposition: Patient tolerated procedure well. Injection site dressed with a band-aid.   Return if symptoms worsen or fail to improve.

## 2018-01-17 ENCOUNTER — Telehealth: Payer: Self-pay | Admitting: Podiatry

## 2018-01-17 NOTE — Telephone Encounter (Signed)
I'm still having pain and discomfort on the lateral outside part of my right foot. I've had two injections and they only give me temporary relief. My question is two fold. What does the doctor next recommend and/or what would be some of the questions, risks, etc typically if he is recommending a surgical procedure? Look forward to hearing from you. My phone number is [phone]. Thank you.

## 2018-01-17 NOTE — Telephone Encounter (Signed)
Left message informing pt, Dr. Samuella CotaPrice would want to see him to discuss his current symptoms and treatment options.

## 2018-02-09 ENCOUNTER — Ambulatory Visit (INDEPENDENT_AMBULATORY_CARE_PROVIDER_SITE_OTHER): Payer: Medicare Other | Admitting: Podiatry

## 2018-02-09 DIAGNOSIS — M779 Enthesopathy, unspecified: Secondary | ICD-10-CM

## 2018-02-09 DIAGNOSIS — M659 Synovitis and tenosynovitis, unspecified: Secondary | ICD-10-CM

## 2018-02-09 NOTE — Patient Instructions (Signed)

## 2018-02-13 ENCOUNTER — Telehealth: Payer: Self-pay | Admitting: Podiatry

## 2018-02-13 NOTE — Telephone Encounter (Signed)
Pt called to discuss soaking instructions. I picked up the line when transferred from scheduler, I spoke pt spoke then the line went dead.

## 2018-02-13 NOTE — Telephone Encounter (Signed)
I called pt and told him he may be soaking 4-6 weeks twice daily and applying the neosporin bandaid after each soak, until the area got a dry hard scab, without redness, drainage or swelling.

## 2018-02-13 NOTE — Telephone Encounter (Signed)
I was there last Thursday and had an ingrown toenail procedure done. I was sent home with an after visit summary on soaking instructions but it does not say how long I need to soak for. If you could call me and let me know how long I need to soak for. You can reach me at [phone]. Thank you.

## 2018-02-23 ENCOUNTER — Ambulatory Visit: Payer: Medicare Other | Admitting: Podiatry

## 2018-03-02 ENCOUNTER — Ambulatory Visit (INDEPENDENT_AMBULATORY_CARE_PROVIDER_SITE_OTHER): Payer: Medicare Other | Admitting: Podiatry

## 2018-03-02 ENCOUNTER — Encounter: Payer: Self-pay | Admitting: Podiatry

## 2018-03-02 DIAGNOSIS — Z9889 Other specified postprocedural states: Secondary | ICD-10-CM

## 2018-03-10 NOTE — Progress Notes (Signed)
  Subjective:  Patient ID: Charles SakaiRobert L Cassady, male    DOB: 1951/05/04,  MRN: [id number]  No chief complaint on file.  67 y.o. male returns for the above complaint.   Objective:   General AA&O x3. Normal mood and affect.  Vascular Foot warm and well perfused with good capillary refill.  Neurologic Sensation grossly intact.  Dermatologic Nail avulsion site healing well without drainage or erythema. Nail bed with overlying soft crust. Left intact. No signs of local infection.  Orthopedic: No tenderness to palpation of the toe.   Assessment & Plan:  Patient was evaluated and treated and all questions answered.  S/p Ingrown Toenail Excision, L -Healing well without issue. -Discussed return precautions. -F/u PRN

## 2018-04-08 IMAGING — US US ABDOMEN LIMITED
1 series · 14 of 25 positions shown · non-contrast
Comparison: None in PACs

CLINICAL DATA: Right upper abdominal pain for the past year.

EXAM:
ULTRASOUND ABDOMEN LIMITED RIGHT UPPER QUADRANT

[Series 1: us abdomen limited · 0.23mm/px · 14 of 46 slices shown]
[im 1/46]
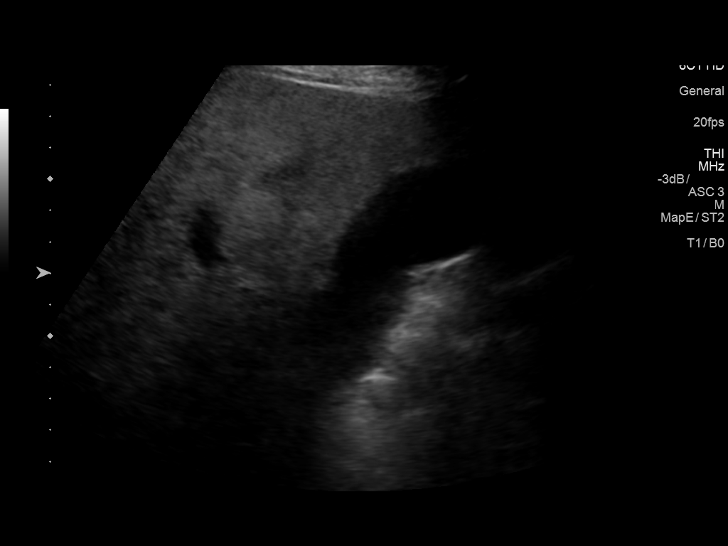
[im 4/46]
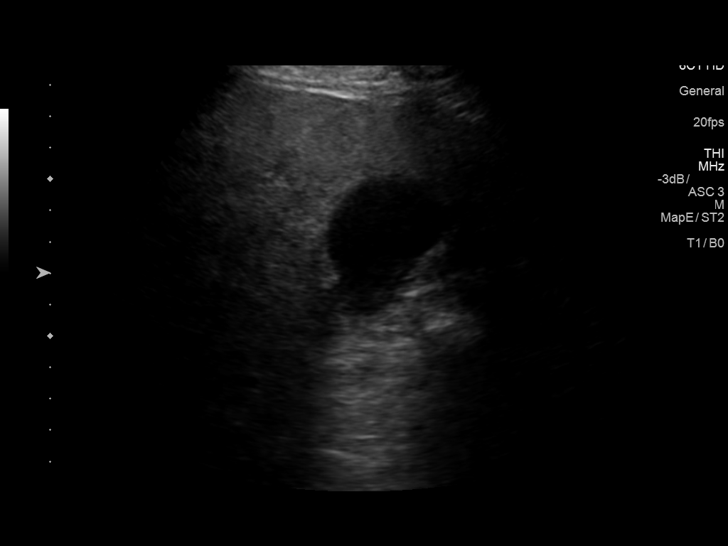
[im 8/46]
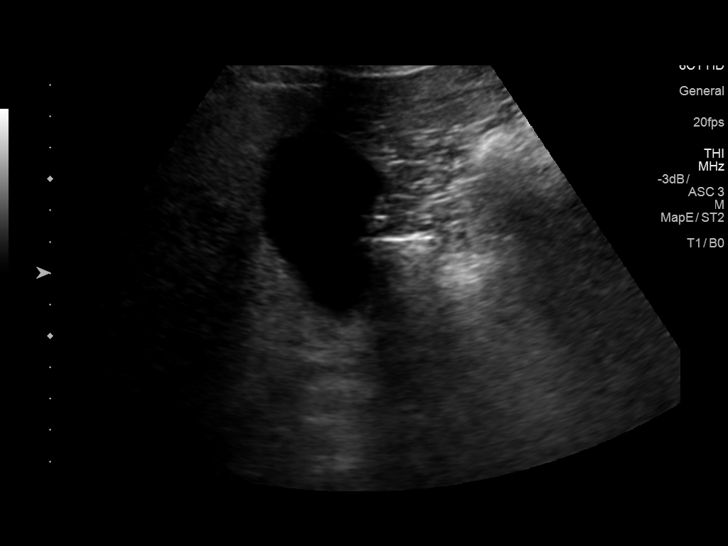
[im 12/46]
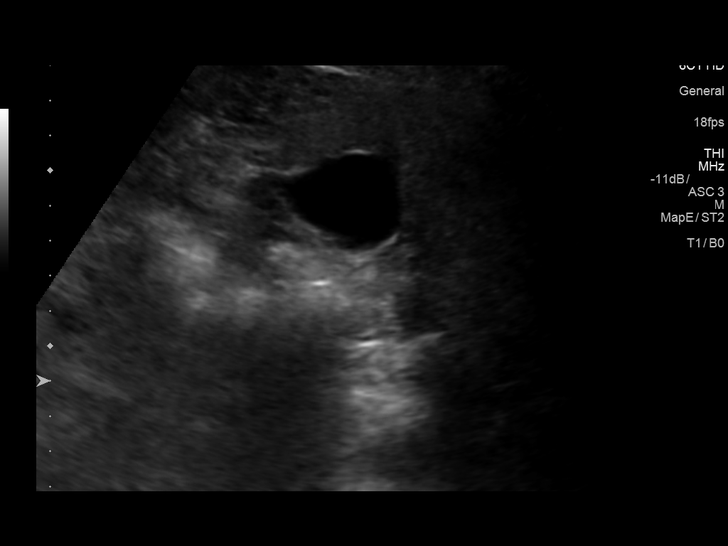
[im 16/46]
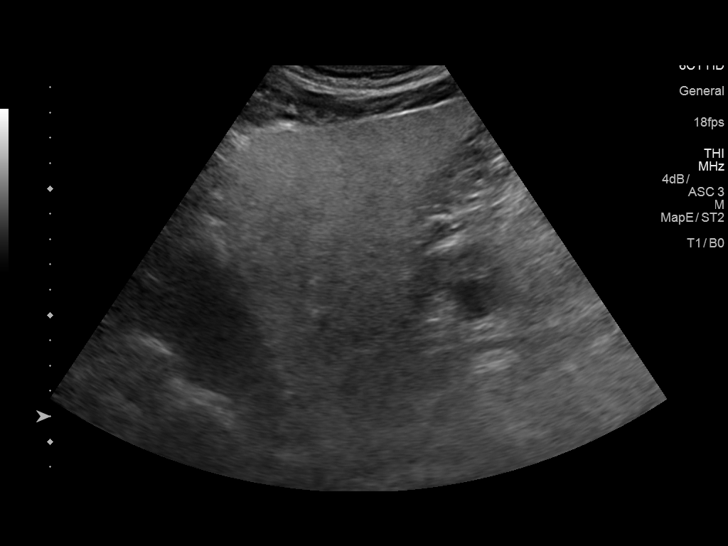
[im 17/46]
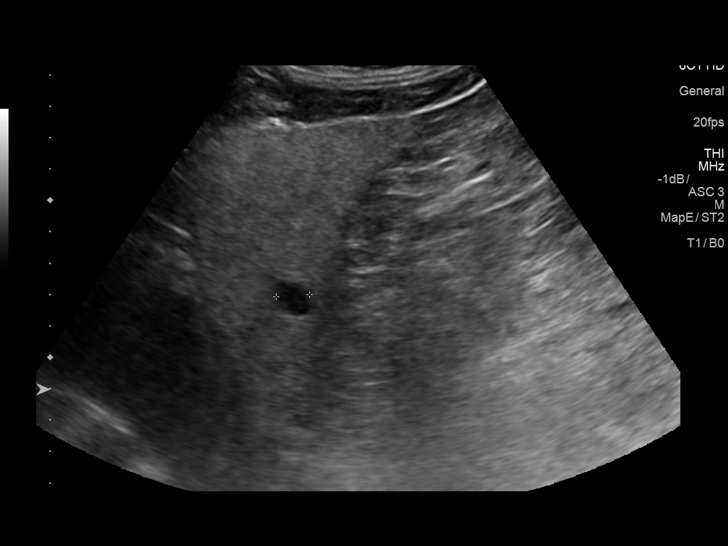
[im 21/46]
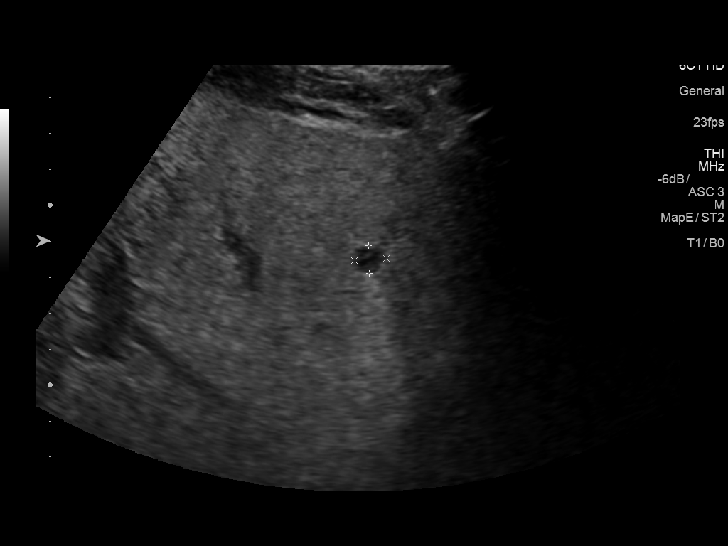
[im 25/46]
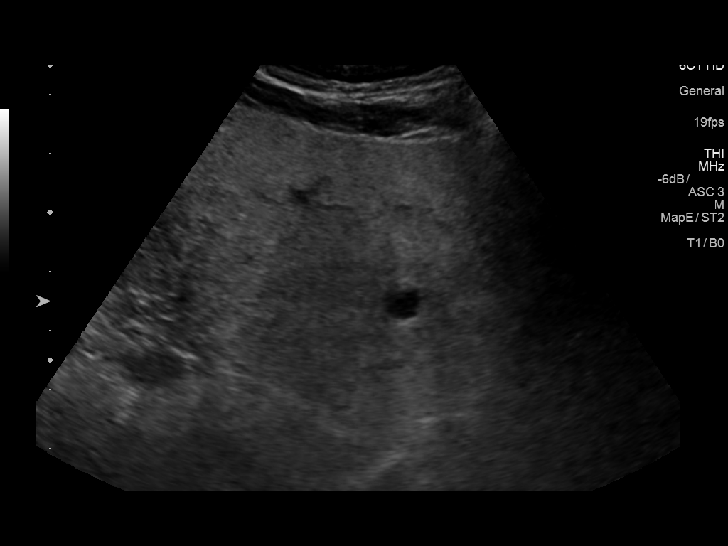
[im 29/46]
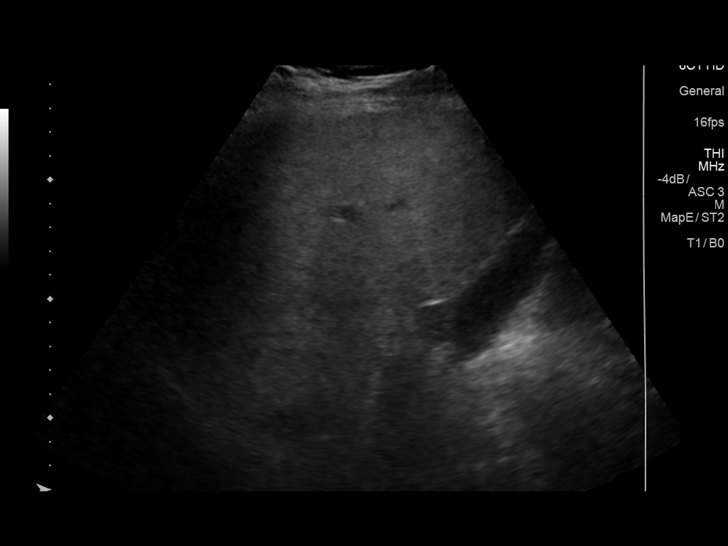
[im 31/46]
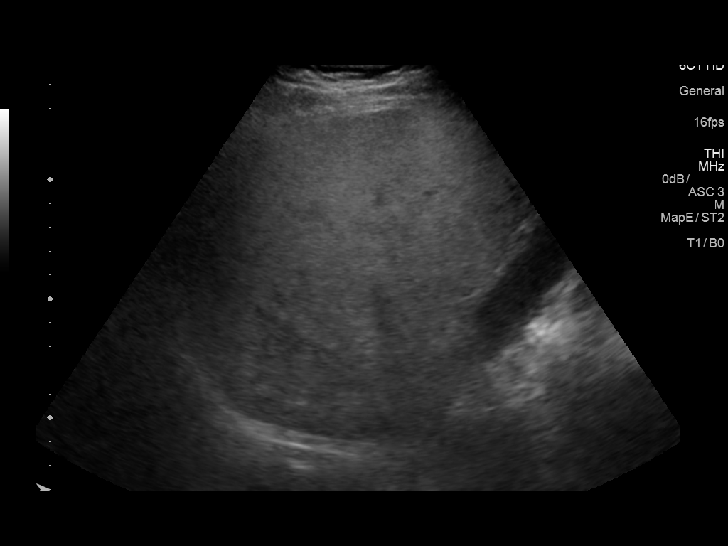
[im 34/46]
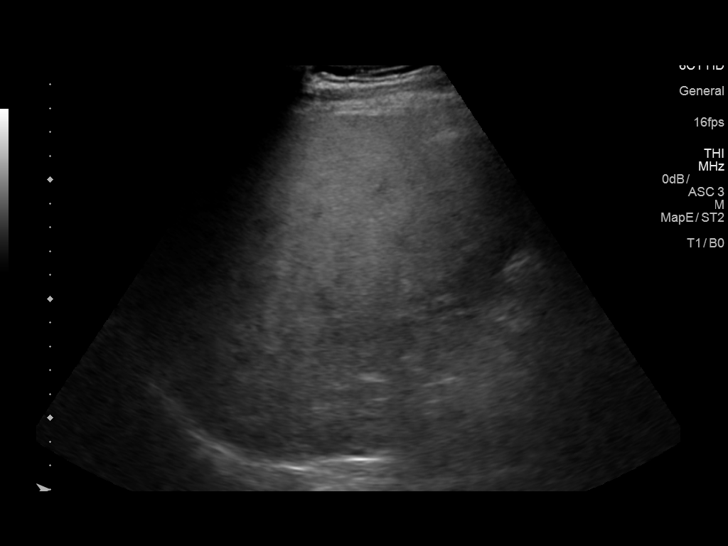
[im 38/46]
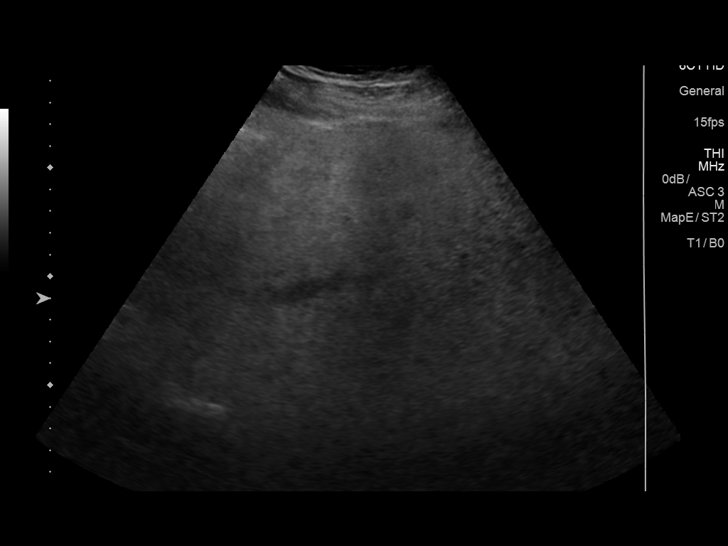
[im 42/46]
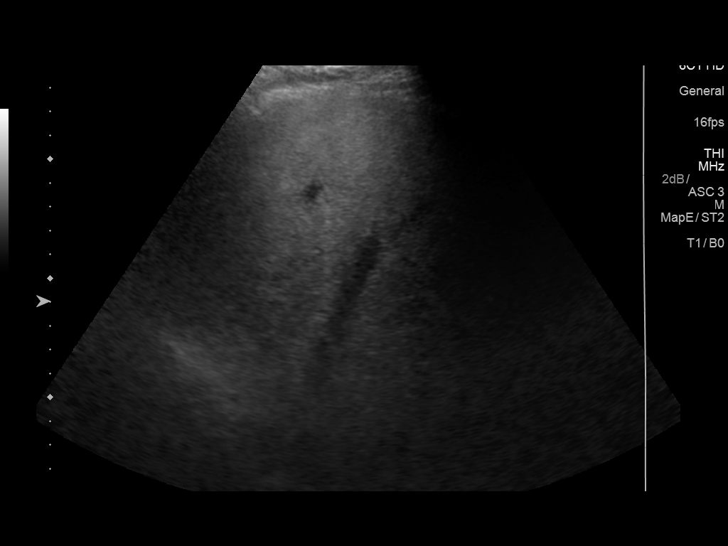
[im 46/46]
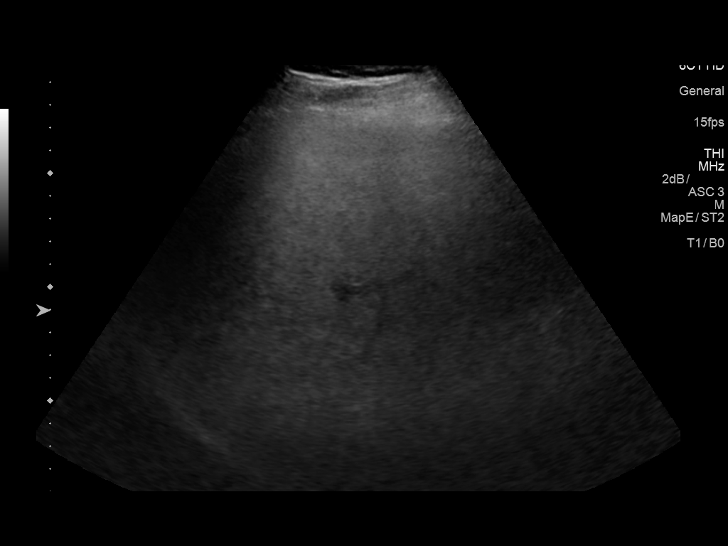

[14 of 25 positions shown; findings below may reference images not displayed]

FINDINGS: Gallbladder:

No gallstones or wall thickening visualized. No sonographic Murphy
sign noted by sonographer.

Common bile duct:

Diameter: 5.1 mm

Liver:

The hepatic echotexture is mildly increased. The surface contour
remains smooth. There is are 2 left lobe cysts. One measures 1.1 cm
in greatest dimension in the other 0.9 cm in greatest dimension. No
solid-appearing masses are observed. There is no intrahepatic ductal
dilation. Portal vein is patent on color Doppler imaging with normal
direction of blood flow towards the liver.
IMPRESSION: Increased hepatic echotexture most compatible with fatty
infiltrative change. Small simple appearing left lobe cyst.

Normal appearance of the gallbladder and common bile duct.

## 2018-04-13 ENCOUNTER — Ambulatory Visit (INDEPENDENT_AMBULATORY_CARE_PROVIDER_SITE_OTHER): Payer: Medicare Other | Admitting: Podiatry

## 2018-04-13 DIAGNOSIS — M779 Enthesopathy, unspecified: Secondary | ICD-10-CM

## 2018-04-13 DIAGNOSIS — L600 Ingrowing nail: Secondary | ICD-10-CM | POA: Diagnosis not present

## 2018-04-13 NOTE — Progress Notes (Signed)
  Subjective:  Patient ID: Charles [name], male    DOB: 01/06/1951,  MRN: [id number]  No chief complaint on file.  67 y.o. male returns for the above complaint.  States that he has been using his  over-the-counter orthotics and has still been experiencing pain.  Would like to discuss custom orthotics as previously discussed with him.  Also states that he started to have pain on the nail of the right great toe.  Objective:  There were no vitals filed for this visit. General AA&O x3. Normal mood and affect.  Vascular Pedal pulses palpable.  Neurologic Epicritic sensation grossly intact.  Dermatologic No open lesions. Skin normal texture and turgor. Incurvated nail R hallux lateral border.  Orthopedic: Pain to palpation R 5th met styloid process, R hallux lateral border.   Assessment & Plan:  Patient was evaluated and treated and all questions answered.  Capsulitis R 5th MPJ -Insert off-loaded for patient -Will make appt for CMOs. Will offload R 5th met styloid process.  R Hallux Nail Ingrown Lateral Border -Debrided in slant back fashion with a nail nipper to patient relief.  Procedure: Nail Debridement Rationale: ingrown nail Type of Debridement: manual, sharp debridement. Instrumentation: Nail nipper Number of Nails: 1     Return in about 6 weeks (around 05/25/2018).

## 2018-05-02 ENCOUNTER — Other Ambulatory Visit: Payer: Medicare Other | Admitting: Orthotics

## 2018-05-17 ENCOUNTER — Telehealth: Payer: Self-pay | Admitting: *Deleted

## 2018-05-17 NOTE — Telephone Encounter (Signed)
Pt states he missed a call from me and he got the information but also thought he had received 3 injections and would like the dates. I informed pt I had reviewed the clinicals, and I had seen in the clinicals 08/10/2017, and 10/13/2017. Pt states he had received 3. Dr. Samuella CotaPrice reviewed the clinicals and states pt received 3rd injection 02/09/2018. Pt states he is still getting sudden sharp pain, without any specific stimuli. I offered pt an appt, stating he should come in to discuss alternative treatments, bring OTC orthotics that pt states Dr. Samuella CotaPrice padded and shoes to give more information as to what is working and not working for the foot. Pt states Dr. Samuella CotaPrice had discussed the custom made orthotics and possible surgical intervention. Pt states he would like to think about it.

## 2018-05-17 NOTE — Telephone Encounter (Signed)
I reviewed pt's clinicals beginning 07/26/2017 with Dr. Al CorpusHyatt the dx was Capsulitis of the 5th Tarsal Metatarsal Tarsal area which is the cuboid area, and he had received 2 injections. Left message as directed by pt in his initial call on 05/16/2018.

## 2018-05-17 NOTE — Telephone Encounter (Signed)
Pt states he is calling for information concerning his records, was the diagnosis of the right foot capsulitis of the cuboid, and he was seen beginning 07/2017 and he would like to know how many injections he received.

## 2018-05-25 ENCOUNTER — Ambulatory Visit: Payer: Medicare Other | Admitting: Podiatry

## 2018-05-29 ENCOUNTER — Telehealth: Payer: Self-pay | Admitting: Podiatry

## 2018-05-29 NOTE — Telephone Encounter (Signed)
Back on 28 February of this year I had an ingrown toenail on the left big toe removed. It is still very tender, red, and maybe slightly swollen on the bottom of that toe. My phone number is [phone].

## 2018-05-29 NOTE — Telephone Encounter (Signed)
Pt states he still has a red area at the base of his toe, and the nail has turned brown, pt states he was seen for something else and mentioned the toenail and Dr. Samuella CotaPrice said it looked ingrown and suggested the procedure, pt states he can't help but think the procedure caused the brown toenail and red area on the toe. I told pt that it needed to be evaluated and transferred to the schedulers.

## 2018-06-05 ENCOUNTER — Ambulatory Visit (INDEPENDENT_AMBULATORY_CARE_PROVIDER_SITE_OTHER): Payer: Medicare Other | Admitting: Podiatry

## 2018-06-05 DIAGNOSIS — L600 Ingrowing nail: Secondary | ICD-10-CM | POA: Diagnosis not present

## 2018-06-05 DIAGNOSIS — M7671 Peroneal tendinitis, right leg: Secondary | ICD-10-CM | POA: Diagnosis not present

## 2018-06-05 MED ORDER — METHYLPREDNISOLONE 4 MG PO TBPK: ORAL_TABLET | ORAL | 0 refills | Status: DC

## 2018-06-05 MED ORDER — MELOXICAM 15 MG PO TABS: 15.0000 mg | ORAL_TABLET | Freq: Every day | ORAL | 1 refills | Status: AC

## 2018-06-05 MED ORDER — GENTAMICIN SULFATE 0.1 % EX CREA: 1.0000 "application " | TOPICAL_CREAM | Freq: Three times a day (TID) | CUTANEOUS | 1 refills | Status: DC

## 2018-06-05 NOTE — Patient Instructions (Signed)
The Day after your procedure:  Place 1/4 cup of epsom salts in a quart of warm tap water.  Submerge your foot or feet in the solution and soak for 20 minutes.  This soak should be done twice a day.  Next, remove your foot or feet from solution, blot dry the affected area. Apply ointment and cover if instructed by your doctor.   IF YOUR SKIN BECOMES IRRITATED WHILE USING THESE INSTRUCTIONS, IT IS OKAY TO SWITCH TO  WHITE VINEGAR AND WATER.  As another alternative soak, you may use antibacterial soap and water.  Monitor for any signs/symptoms of infection. Call the office immediately if any occur or go directly to the emergency room. Call with any questions/concerns.   Long Term Care Instructions-Post Nail Surgery  You have had your ingrown toenail and root treated with a chemical.  This chemical causes a burn that will drain and ooze like a blister.  This can drain for 6-8 weeks or longer.  It is important to keep this area clean, covered, and follow the soaking instructions dispensed at the time of your surgery.  This area will eventually dry and form a scab.  Once the scab forms you no longer need to soak or apply a dressing.  If at any time you experience an increase in pain, redness, swelling, or drainage, you should contact the office as soon as possible.
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
## 2018-06-13 ENCOUNTER — Telehealth: Payer: Self-pay | Admitting: Podiatry

## 2018-06-13 NOTE — Telephone Encounter (Signed)
I informed pt, that now he was getting some pain relief, he needed let the area rest and strengthen, so he needed to stay in the boot. Pt states understanding and agreed to stay in the boot.

## 2018-06-13 NOTE — Telephone Encounter (Signed)
I saw Dr. Logan BoresEvans last Monday 24 June to address tendonitis in my right foot. He placed me in a walking boot, a cam boot and put me on some antiinflammatories. I finished the prednisone and am to follow up with meloxicam. My question is, he did say he wanted me to try and wear the boot as much as possible for 3-4 weeks. I've literally kept it on except for sleeping most of the time. The foot feels the best since the onset of the tendonitis. My question is, do I need to continue to wear the boot? Can I leave it off a day and see how the foot does? My number is [phone]. Thank you.

## 2018-07-02 NOTE — Progress Notes (Signed)
  Subjective:  Patient ID: Charles SakaiRobert L [name], male    DOB: May 14, 1951,  MRN: [id number]  Chief Complaint  Patient presents with  . Foot Pain    F/U Rt foot pain Pt. stated," the padding on the orthotics are not helping. Also, the pain is about the same; 4/10 achy-burning pain."  . Nail Problem    L great toenail medial border x 6 mo; tender Tx: none   67 y.o. male returns for the above complaint.  States the pain in the orthotics are not helping.  Pain is about the same - 4/10 aching burning pain.  Objective:  There were no vitals filed for this visit.  General AA&O x3. Normal mood and affect.  Vascular Pedal pulses palpable.  Neurologic Epicritic sensation grossly intact.  Dermatologic No open lesions. Skin normal texture and turgor.  Orthopedic: Pain to palpation right fifth metatarsal base joint capsule    Assessment & Plan:  Patient was evaluated and treated and all questions answered.  Enthesitis fifth metatarsal base -Injection delivered as below -Additional padding applied to patient's orthotic  Procedure: Joint Injection Location: Right 5th metatarsal base Skin Prep: Alcohol. Injectate: 0.5 cc 1% lidocaine plain, 0.5 cc dexamethasone phosphate. Disposition: Patient tolerated procedure well. Injection site dressed with a band-aid.   Return in about 2 weeks (around 02/23/2018).

## 2018-07-05 ENCOUNTER — Ambulatory Visit (INDEPENDENT_AMBULATORY_CARE_PROVIDER_SITE_OTHER): Payer: Medicare Other | Admitting: Podiatry

## 2018-07-05 ENCOUNTER — Encounter: Payer: Self-pay | Admitting: Podiatry

## 2018-07-05 DIAGNOSIS — M7671 Peroneal tendinitis, right leg: Secondary | ICD-10-CM | POA: Diagnosis not present
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
## 2018-08-16 ENCOUNTER — Ambulatory Visit: Payer: Medicare Other | Admitting: Podiatry

## 2018-09-27 ENCOUNTER — Telehealth: Payer: Self-pay | Admitting: Podiatry

## 2018-09-27 NOTE — Telephone Encounter (Signed)
Spoke to pt and informed him it was fine to have another injection. Advised him to call back and make another appointment.

## 2018-09-27 NOTE — Telephone Encounter (Signed)
I'm having a flair up and I wanted to know if its too soon to get another cortisone injection? Please call me back at [phone]. Thank you.

## 2018-09-29 ENCOUNTER — Ambulatory Visit (INDEPENDENT_AMBULATORY_CARE_PROVIDER_SITE_OTHER): Payer: Medicare Other | Admitting: Podiatry

## 2018-09-29 DIAGNOSIS — M7671 Peroneal tendinitis, right leg: Secondary | ICD-10-CM | POA: Diagnosis not present

## 2018-09-29 MED ORDER — MELOXICAM 15 MG PO TABS: 15.0000 mg | ORAL_TABLET | Freq: Every day | ORAL | 0 refills | Status: DC

## 2018-10-01 NOTE — Progress Notes (Signed)
Subjective: 67 year old male presents the office today for concerns of recurrent pain the onset aspect of his right foot.  He previously was diagnosed with peroneal tendinitis he has been seen by Dr. Logan Bores for this.  He had an injection which helped quite a bit.  Over the last week he is to have recurrence of pain.  He was inquiring about possible steroid injection.  He does relate that his symptoms are greatly improved for the last 48 hours.  He denies any recent injury or trauma no swelling. Denies any systemic complaints such as fevers, chills, nausea, vomiting. No acute changes since last appointment, and no other complaints at this time.   Objective: AAO x3, NAD DP/PT pulses palpable bilaterally, CRT less than 3 seconds There is mild tenderness palpation along the fifth metatarsal base on the insertion of the peroneal tendon.  The peroneal tendon appears to be intact.  There is trace edema and there is no erythema or increase in warmth.  Again he states that his symptoms are improving over the last 48 hours.  Is no other areas of tenderness identified at this time.  Plantar fascia, Achilles tendon, flexor, extensor tendons appear to be intact. No open lesions or pre-ulcerative lesions.  No pain with calf compression, swelling, warmth, erythema  Assessment: Recurrent peroneal tendinitis right foot  Plan: -All treatment options discussed with the patient including all alternatives, risks, complications.  -At this time we discussed a steroid injection however this is not an area I would keep getting injections.  Since his symptoms are greatly improved we will start with anti-inflammatories I prescribed meloxicam.  Ice to the area as well and continue wearing supportive shoes discussed inserts.  Next couple weeks if symptoms continue we can do steroid injection at that point.  However since his symptoms are improving will hold off on this. -Patient encouraged to call the office with any questions,  concerns, change in symptoms.   Vivi Barrack DPM

## 2018-10-23 ENCOUNTER — Ambulatory Visit: Payer: Medicare Other | Admitting: Podiatry

## 2018-12-19 ENCOUNTER — Ambulatory Visit (INDEPENDENT_AMBULATORY_CARE_PROVIDER_SITE_OTHER): Payer: Medicare Other

## 2018-12-19 ENCOUNTER — Ambulatory Visit (INDEPENDENT_AMBULATORY_CARE_PROVIDER_SITE_OTHER): Payer: Medicare Other | Admitting: Podiatry

## 2018-12-19 DIAGNOSIS — M2141 Flat foot [pes planus] (acquired), right foot: Secondary | ICD-10-CM

## 2018-12-19 DIAGNOSIS — M7671 Peroneal tendinitis, right leg: Secondary | ICD-10-CM

## 2018-12-19 DIAGNOSIS — M779 Enthesopathy, unspecified: Secondary | ICD-10-CM | POA: Diagnosis not present

## 2018-12-19 DIAGNOSIS — T148XXA Other injury of unspecified body region, initial encounter: Secondary | ICD-10-CM

## 2018-12-19 DIAGNOSIS — M2142 Flat foot [pes planus] (acquired), left foot: Secondary | ICD-10-CM

## 2018-12-19 DIAGNOSIS — M79671 Pain in right foot: Secondary | ICD-10-CM

## 2018-12-20 ENCOUNTER — Other Ambulatory Visit: Payer: Self-pay | Admitting: Podiatry

## 2018-12-20 ENCOUNTER — Telehealth: Payer: Self-pay | Admitting: *Deleted

## 2018-12-20 DIAGNOSIS — M779 Enthesopathy, unspecified: Secondary | ICD-10-CM

## 2018-12-20 DIAGNOSIS — M7671 Peroneal tendinitis, right leg: Secondary | ICD-10-CM

## 2018-12-20 DIAGNOSIS — T148XXA Other injury of unspecified body region, initial encounter: Secondary | ICD-10-CM

## 2018-12-20 NOTE — Progress Notes (Signed)
Subjective: 68 year old male presents the office today for follow-up evaluation and continued pain on the outside aspect of right foot.  He states the injection helped for some time as well as oral steroids and is tried some over-the-counter inserts with some improvement but despite this is continued pain to the anterior aspect of the foot.  He states that his neighbor is an anesthesiologist and they were concerned that maybe there was an infection inside.  Given how long this been ongoing for.  He denies any recent injury or trauma He has no other. Denies any systemic complaints such as fevers, chills, nausea, vomiting. No acute changes since last appointment, and no other complaints at this time.   Objective: AAO x3, NAD DP/PT pulses palpable bilaterally, CRT less than 3 seconds On nonweightbearing exam there is prominence to the fifth metatarsal base.  There is localized edema to this area.  Minimal erythema but this is more from inflammation and rubbing inside the shoes but there is no increase in warmth or open sores.  Minimal discomfort on the distal portion of the peroneal tendon on insertion into the fifth metatarsal base.  Peroneal tendon appears to be intact.  No other areas of tenderness.  Upon weightbearing evaluation there is a decrease in medial arch height.  No open lesions or pre-ulcerative lesions.  No pain with calf compression, swelling, warmth, erythema  Assessment: Insertional peroneal tendinitis, rule out tear  Plan: -All treatment options discussed with the patient including all alternatives, risks, complications.  -Repeat x-rays were obtained today.  No evidence of acute fracture. -At this point is had numerous conservative treatments any significant resolution.  We will order an MRI to evaluate the distal portion of the peroneal tendon to rule out a tear.  I also discussed other treatments including custom orthotics but would await the results of the MRI.  He agrees to this  plan. -Patient encouraged to call the office with any questions, concerns, change in symptoms.    Vivi Barrack DPM

## 2018-12-20 NOTE — Telephone Encounter (Signed)
Orders to J. Quintana, RN for pre-cert, faxed to [facility]. 

## 2018-12-20 NOTE — Telephone Encounter (Signed)
-----   Message from Vivi Barrack, DPM sent at 12/20/2018  8:45 AM EST ----- Can you please order an MRI of the foot and have them include as much of the ankle as possible? He is having pain right at the 5th metatarsal base at the insertion of the peroneal tendon. I want to do the MRI to rule out peroneal tendon tear. Thanks.

## 2018-12-28 ENCOUNTER — Encounter: Payer: Self-pay | Admitting: Neurology

## 2018-12-29 ENCOUNTER — Ambulatory Visit
Admission: RE | Admit: 2018-12-29 | Discharge: 2018-12-29 | Disposition: A | Discharge status: Home / Self Care | Payer: Medicare Other | Source: Ambulatory Visit | Attending: Podiatry | Admitting: Podiatry

## 2018-12-29 DIAGNOSIS — M779 Enthesopathy, unspecified: Secondary | ICD-10-CM

## 2018-12-29 DIAGNOSIS — T148XXA Other injury of unspecified body region, initial encounter: Secondary | ICD-10-CM

## 2019-01-02 ENCOUNTER — Encounter: Payer: Self-pay | Admitting: Neurology

## 2019-01-02 ENCOUNTER — Ambulatory Visit (INDEPENDENT_AMBULATORY_CARE_PROVIDER_SITE_OTHER): Payer: Medicare Other | Admitting: Neurology

## 2019-01-02 VITALS — BP 134/86 | HR 62 | Ht 72.0 in | Wt 232.0 lb

## 2019-01-02 DIAGNOSIS — G4730 Sleep apnea, unspecified: Secondary | ICD-10-CM | POA: Diagnosis not present

## 2019-01-02 DIAGNOSIS — R5383 Other fatigue: Secondary | ICD-10-CM

## 2019-01-02 DIAGNOSIS — R0683 Snoring: Secondary | ICD-10-CM | POA: Diagnosis not present

## 2019-01-02 DIAGNOSIS — G4710 Hypersomnia, unspecified: Secondary | ICD-10-CM

## 2019-01-02 DIAGNOSIS — R419 Unspecified symptoms and signs involving cognitive functions and awareness: Secondary | ICD-10-CM
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
## 2019-01-03 ENCOUNTER — Telehealth: Payer: Self-pay | Admitting: Podiatry

## 2019-01-03 NOTE — Telephone Encounter (Signed)
I'm calling to get the results of the MRI I had done on 17 January. Please call me back as I've been dealing with this issue for 19 months.

## 2019-01-04 ENCOUNTER — Telehealth: Payer: Self-pay | Admitting: *Deleted

## 2019-01-04 NOTE — Telephone Encounter (Signed)
Val- please let him know that the there was no tendon issues on the outside of the foot/ankle. There was some mild tendonitis along the medial/inside part of the ankle. There is essentially a small area of arthritis in the ankle as well. At this point I would recommed custom orthotics that we talked about. If he is interested please have him see Raiford Noble. He is more than welcome to come in to see me as well.

## 2019-01-04 NOTE — Telephone Encounter (Signed)
I informed pt of Dr. Gabriel Rung review of the results and pt states he would like to be fitted for the orthotics and I transferred to the scheduler.

## 2019-01-04 NOTE — Telephone Encounter (Signed)
-----   Message from Vivi Barrack, DPM sent at 01/04/2019  7:09 AM EST ----- Val- please let him know that the there was no tendon issues on the outside of the foot/ankle. There was some mild tendonitis along the medial/inside part of the ankle. There is essentially a small area of arthritis in the ankle as well. At this point I would recommed custom orthotics that we talked about. If he is interested please have him see Raiford Noble. He is more than welcome to come in to see me as well.

## 2019-01-10 ENCOUNTER — Ambulatory Visit (INDEPENDENT_AMBULATORY_CARE_PROVIDER_SITE_OTHER): Payer: Medicare Other | Admitting: Orthotics

## 2019-01-10 DIAGNOSIS — M779 Enthesopathy, unspecified: Secondary | ICD-10-CM

## 2019-01-10 DIAGNOSIS — T148XXA Other injury of unspecified body region, initial encounter: Secondary | ICD-10-CM

## 2019-01-10 DIAGNOSIS — M2141 Flat foot [pes planus] (acquired), right foot: Secondary | ICD-10-CM

## 2019-01-10 DIAGNOSIS — M2142 Flat foot [pes planus] (acquired), left foot: Secondary | ICD-10-CM

## 2019-01-10 DIAGNOSIS — M7671 Peroneal tendinitis, right leg: Secondary | ICD-10-CM

## 2019-01-10 NOTE — Progress Notes (Signed)
Patient came into today to be cast for Custom Foot Orthotics. Upon recommendation of Dr. Jacqualyn Posey Patient presents with pes planus, but prominent 5th met base which causes pain Goals are arch support and 5th met base offloading Plan vendor [employer]

## 2019-02-01 ENCOUNTER — Ambulatory Visit: Payer: Medicare Other | Admitting: Orthotics

## 2019-02-01 DIAGNOSIS — M779 Enthesopathy, unspecified: Secondary | ICD-10-CM

## 2019-02-01 DIAGNOSIS — T148XXA Other injury of unspecified body region, initial encounter: Secondary | ICD-10-CM

## 2019-02-01 DIAGNOSIS — M7671 Peroneal tendinitis, right leg: Secondary | ICD-10-CM

## 2019-02-01 DIAGNOSIS — M2141 Flat foot [pes planus] (acquired), right foot: Secondary | ICD-10-CM

## 2019-02-01 DIAGNOSIS — M2142 Flat foot [pes planus] (acquired), left foot: Secondary | ICD-10-CM

## 2019-02-01 NOTE — Progress Notes (Signed)
Patient came in today to pick up custom made foot orthotics.  The goals were accomplished and the patient reported no dissatisfaction with said orthotics.  Patient was advised of breakin period and how to report any issues. 

## 2019-03-21 ENCOUNTER — Ambulatory Visit (INDEPENDENT_AMBULATORY_CARE_PROVIDER_SITE_OTHER): Payer: Medicare Other | Admitting: Neurology

## 2019-03-21 DIAGNOSIS — G4733 Obstructive sleep apnea (adult) (pediatric): Secondary | ICD-10-CM | POA: Diagnosis not present

## 2019-03-21 DIAGNOSIS — R351 Nocturia: Secondary | ICD-10-CM

## 2019-03-21 DIAGNOSIS — G4719 Other hypersomnia: Secondary | ICD-10-CM

## 2019-04-09 ENCOUNTER — Encounter: Payer: Self-pay | Admitting: Neurology

## 2019-04-09 ENCOUNTER — Telehealth: Payer: Self-pay | Admitting: Neurology

## 2019-04-09 DIAGNOSIS — G4733 Obstructive sleep apnea (adult) (pediatric): Secondary | ICD-10-CM

## 2019-04-09 DIAGNOSIS — G4719 Other hypersomnia: Secondary | ICD-10-CM | POA: Insufficient documentation

## 2019-04-09 DIAGNOSIS — R351 Nocturia: Secondary | ICD-10-CM | POA: Insufficient documentation

## 2019-04-09 HISTORY — DX: Obstructive sleep apnea (adult) (pediatric): G47.33

## 2019-04-09 NOTE — Telephone Encounter (Signed)
-----   Message from Melvyn Novas, MD sent at 04/09/2019  9:04 AM EDT ----- He is now ready for CPAP or Occupational psychologist.  Summary & Diagnosis:   Severe OSA with an AHI of 41.9/h and only marginally exacerbated  in REM sleep. Few central apneas.  Few additional snoring related arousals were found (RERAs  reflected in the RDI category).  There is some hypoxemia- over 40 minutes, but marginally clinical  significant in proportion to overall sleep time.    Recommendations:    Auto titration CPAP would still be recommended.  If the patient cannot use CPAP, is unable to tolerate CPAP, will  refer for the inspire device to ENT.  For now, Auto CPAP, with heated humidity and a pressure window  from 6-18 cm water, 3 cm EPR, interface of patients choice and  comfort.

## 2019-04-09 NOTE — Procedures (Signed)
Patient Information     First Name: Charles CheadleRobert L Last Name: [name] ID: [id number]  Birth Date: Jun 30, 1951 Age: 68 Gender: Male  Referring Provider: Jarome Matinaniel Paterson, MD BMI: 31.4 (W=231 lbs, H=6' 0'')  Reading Provider: Neck Circ.:  Melvyn Novasarmen Zayna Toste, MD  18 ''  Epworth:  0/24   Sleep Study Information    Study Date: Mar 28, 2019 S/H/A Version: 5.1.76.4 / 4.1.1531 / 6876   History: [name] is a 68 year old male patient who was seen at [facility] 5 years ago. His apnea was borderline according to a sleep study done at [facility]. In physical exam, an enlarged uvula was found, He sleeps with an elevated head of bed, and snores loudly, also drinks daily alcohol. He was referred to ENT. He just recently cut down on alcohol consumption, and has not felt this higher sleep quality for a while.  Still has nocturia and snoring, RLS. Crazy dreams, he really needs an attended sleep study, but I at this time only HST is possible.  He had tried a dental device to treat snoring and apnea, but this was a very uncomfortable device apparently he used a Emergency planning/management officerMoses device.  Later he invested in a snore -eX which was more comfortable, but still caused his TMJ to be strained and to develop a click.  Diffuse teeth loosening was the result.  He is now ready for CPAP or Occupational psychologistnspire technology.   Summary & Diagnosis:    Severe OSA with an AHI of 41.9/h and only marginally exacerbated in REM sleep. Few central apneas.  Few additional snoring related arousals were found (RERAs reflected in the RDI category).   There is some hypoxemia- over 40 minutes, but marginally clinical significant in proportion to overall sleep time.    Recommendations:      Auto titration CPAP would still be recommended.  If the patient cannot use CPAP, is unable to tolerate CPAP, will refer for the inspire device to ENT.  For now, Auto CPAP, with heated humidity and a pressure window from 6-18 cm water, 3 cm EPR, interface of patients choice  and comfort.    Signature:  Melvyn Novasarmen Mairin Lindsley, MD  04-07-2019                                Sleep Summary  Oxygen Saturation Statistics   Start Study Time: End Study Time: Total Recording Time:  10:01:22 PM 7:35:11 AM 9 h, 33 min  Total Sleep Time % REM of Sleep Time:  8 h, 42 min 23.9    Mean: 92 Minimum: 80 Maximum: 99  Mean of Desaturations Nadirs (%):   90  Oxygen Desaturation SpO2 %:   4-9 10-20 >20 Total  Events Number Total   261  27 90.6 9.4  0 0.0  288 100.0  Oxygen Saturation in Time : <90 <=88 <85 <80 <70  Duration (minutes): Sleep % 43.5 8.3 26.0 6.5 5.0 1.2 0.0 0.0 0.0 0.0     Respiratory Indices      Total Events REM NREM All Night  pRDI:  361  pAHI:  359 ODI:  288 pAHIc:  10 % CSR: 0.0 48.1 47.2 42.4 3.8 39.9 39.9 30.5 1.0 41.9 41.6 33.4 1.7       Pulse Rate Statistics during Sleep (BPM)      Mean: 56 Minimum: N/A Maximum: 93    Indices are calculated using technically valid sleep time  of  8 hrs, 37 min. Central-Indices are calculated using technically valid sleep time of  5  hrs, 46 min. pRDI/pAHI are calculated using oxi desaturations ? 3% Si Body Position Statistics  Position Supine Prone Right Left Non-Supine  Sleep (min) 15.3 0.0 267.5 239.5 507.0  Sleep % 2.9 0.0 51.2 45.9 97.1  pRDI 95.0 N/A 35.8 45.4 40.3  pAHI 95.0 N/A 35.4 45.4 40.1  ODI 78.4 N/A 27.0 37.8 32.1     Snoring Statistics Snoring Level (dB) >40 >50 >60 >70 >80 >Threshold (45)  Sleep (min) 397.2 126.4 2.6 0.0 0.0 273.5  Sleep % 76.0 24.2 0.5 0.0 0.0 52.4    Mean: 46 dB Sleep Stages Chart                                                               pAHI=41.6                                                                            Mild              Moderate                    Severe                                                    5              15                    30  * Reference values are

## 2019-04-09 NOTE — Addendum Note (Signed)
Addended by: Melvyn Novas on: 04/09/2019 09:04 AM   Modules accepted: Orders

## 2019-04-09 NOTE — Telephone Encounter (Signed)
I called pt. I advised pt that Dr. Vickey Huger reviewed their sleep study results and found that pt has severe sleep apnea. Dr. Vickey Huger recommends that pt starts auto CPAP. I reviewed PAP compliance expectations with the pt. Pt is agreeable to starting a CPAP. I advised pt that an order will be sent to a DME, [facility], and [facility] will call the pt within about one week after they file with the pt's insurance. [facility] will show the pt how to use the machine, fit for masks, and troubleshoot the CPAP if needed. A follow up appt was made for insurance purposes with Butch Penny on June 10,2020. Pt verbalized understanding to arrive 15 minutes early and bring their CPAP. A letter with all of this information in it will be mailed to the pt as a reminder. I verified with the pt that the address we have on file is correct. Pt verbalized understanding of results. Pt had no questions at this time but was encouraged to call back if questions arise. I have sent the order to [facility] and have received confirmation that they have received the order.

## 2019-04-12 NOTE — Telephone Encounter (Signed)
Pt called and I reviewed the DME process. Pt verbalized understanding. I gave him the phone number and name to the company for him to contact if he has not heard anything by Monday.

## 2019-05-21 ENCOUNTER — Telehealth: Payer: Self-pay | Admitting: *Deleted

## 2019-05-21 NOTE — Telephone Encounter (Signed)
Due to current COVID 19 pandemic, our office is severely reducing in office visits until further notice, in order to minimize the risk to our patients and healthcare providers.  Pt understands that although there may be some limitations with this type of visit, we will take all precautions to reduce any security or privacy concerns.  Pt understands that this will be treated like an in office visit and we will file with pt's insurance, and there may be a patient responsible charge related to this service. Pt consented to doxy.me visit.  Email sent to [email or web address]

## 2019-05-22 ENCOUNTER — Encounter: Payer: Self-pay | Admitting: Adult Health

## 2019-05-23 ENCOUNTER — Ambulatory Visit: Payer: Self-pay | Admitting: Adult Health

## 2019-05-28 ENCOUNTER — Encounter: Payer: Self-pay | Admitting: Adult Health

## 2019-05-28 ENCOUNTER — Ambulatory Visit (INDEPENDENT_AMBULATORY_CARE_PROVIDER_SITE_OTHER): Payer: Medicare Other | Admitting: Adult Health

## 2019-05-28 ENCOUNTER — Other Ambulatory Visit: Payer: Self-pay

## 2019-05-28 DIAGNOSIS — Z9989 Dependence on other enabling machines and devices: Secondary | ICD-10-CM | POA: Diagnosis not present

## 2019-05-28 DIAGNOSIS — G4733 Obstructive sleep apnea (adult) (pediatric): Secondary | ICD-10-CM | POA: Diagnosis not present
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
## 2019-11-28 ENCOUNTER — Ambulatory Visit (INDEPENDENT_AMBULATORY_CARE_PROVIDER_SITE_OTHER): Payer: Medicare Other | Admitting: Adult Health

## 2019-11-28 ENCOUNTER — Other Ambulatory Visit: Payer: Self-pay

## 2019-11-28 ENCOUNTER — Encounter: Payer: Self-pay | Admitting: Adult Health

## 2019-11-28 VITALS — BP 130/81 | HR 67 | Temp 97.0°F | Ht 72.0 in | Wt 237.6 lb

## 2019-11-28 DIAGNOSIS — G4733 Obstructive sleep apnea (adult) (pediatric): Secondary | ICD-10-CM | POA: Diagnosis not present

## 2019-11-28 DIAGNOSIS — Z9989 Dependence on other enabling machines and devices: Secondary | ICD-10-CM

## 2019-11-28 NOTE — Patient Instructions (Addendum)
Continue using CPAP nightly and greater than 4 hours each night °If your symptoms worsen or you develop new symptoms please let us know.  ° °
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
## 2019-12-26 ENCOUNTER — Ambulatory Visit: Payer: Medicare Other | Attending: Internal Medicine

## 2019-12-26 DIAGNOSIS — Z20822 Contact with and (suspected) exposure to covid-19: Secondary | ICD-10-CM

## 2019-12-27 LAB — NOVEL CORONAVIRUS, NAA: SARS-CoV-2, NAA: NOT DETECTED

## 2020-01-10 ENCOUNTER — Ambulatory Visit: Payer: Medicare Other

## 2020-01-15 ENCOUNTER — Ambulatory Visit: Payer: Medicare Other

## 2020-01-19 ENCOUNTER — Ambulatory Visit: Payer: Medicare Other | Attending: Internal Medicine

## 2020-01-19 DIAGNOSIS — Z23 Encounter for immunization: Secondary | ICD-10-CM | POA: Insufficient documentation

## 2020-01-19 NOTE — Progress Notes (Signed)
   Covid-19 Vaccination Clinic  Name:  Charles [name]    MRN: [id number] DOB: 01-Dec-1951  01/19/2020  [name] was observed post Covid-19 immunization for 15 minutes without incidence. He was provided with Vaccine Information Sheet and instruction to access the V-Safe system.   [name] was instructed to call 911 with any severe reactions post vaccine: Marland Kitchen Difficulty breathing  . Swelling of your face and throat  . A fast heartbeat  . A bad rash all over your body  . Dizziness and weakness    Immunizations Administered    Name Date Dose VIS Date Route   Pfizer COVID-19 Vaccine 01/19/2020  8:43 AM 0.3 mL 11/23/2019 Intramuscular   Manufacturer: [employer], [employer]   Lot: [id number]   NDC: [address]-3

## 2020-02-11 ENCOUNTER — Ambulatory Visit: Payer: Self-pay

## 2020-02-12 ENCOUNTER — Ambulatory Visit: Payer: Medicare Other

## 2020-02-12 ENCOUNTER — Ambulatory Visit: Payer: Medicare Other | Attending: Internal Medicine

## 2020-02-12 DIAGNOSIS — Z23 Encounter for immunization: Secondary | ICD-10-CM | POA: Insufficient documentation

## 2020-02-12 NOTE — Progress Notes (Signed)
   Covid-19 Vaccination Clinic  Name:  Charles [name]    MRN: [id number] DOB: [birth year]  02/12/2020  Charles [name] was observed post Covid-19 immunization for 15 minutes without incident. He was provided with Vaccine Information Sheet and instruction to access the V-Safe system.   Charles [name] was instructed to call 911 with any severe reactions post vaccine: Marland Kitchen Difficulty breathing  . Swelling of face and throat  . A fast heartbeat  . A bad rash all over body  . Dizziness and weakness   Immunizations Administered    Name Date Dose VIS Date Route   Pfizer COVID-19 Vaccine 02/12/2020  9:53 AM 0.3 mL 11/23/2019 Intramuscular   Manufacturer: [employer], [employer]   Lot: [id number]   NDC: [address]-4

## 2020-02-19 ENCOUNTER — Ambulatory Visit: Payer: Medicare Other

## 2020-08-13 ENCOUNTER — Telehealth: Payer: Self-pay | Admitting: Podiatry

## 2020-08-13 NOTE — Telephone Encounter (Signed)
Pt left message @ 143pm today with questions about orthotics he received. When did he received them and how long do they last and looking at replacement.  Returned call and left message for pt to call to discuss further but he picked them up in feb 2020. And the cost for a new pair is 438.00 but we also can refurbish the old ones for 90.00 but to call to discuss further.

## 2020-08-14 NOTE — Telephone Encounter (Signed)
Pt left message this morning and I returned call and he is thinking of just refurbishing the old orthotics. He is aware of the 90.00 cost and is also aware of the cost for a new pair. I told pt he can just drop them off and pay the 90.00 and I would call pt to just pick them up when they are back in.

## 2020-08-19 DIAGNOSIS — M79676 Pain in unspecified toe(s): Secondary | ICD-10-CM

## 2020-11-27 ENCOUNTER — Encounter: Payer: Self-pay | Admitting: Adult Health

## 2020-11-27 ENCOUNTER — Ambulatory Visit (INDEPENDENT_AMBULATORY_CARE_PROVIDER_SITE_OTHER): Payer: Medicare Other | Admitting: Adult Health

## 2020-11-27 VITALS — BP 141/89 | HR 59 | Ht 72.0 in | Wt 237.0 lb

## 2020-11-27 DIAGNOSIS — Z9989 Dependence on other enabling machines and devices: Secondary | ICD-10-CM | POA: Diagnosis not present

## 2020-11-27 DIAGNOSIS — G4733 Obstructive sleep apnea (adult) (pediatric): Secondary | ICD-10-CM | POA: Diagnosis not present
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
## 2020-11-27 NOTE — Patient Instructions (Signed)

## 2021-12-01 ENCOUNTER — Telehealth (INDEPENDENT_AMBULATORY_CARE_PROVIDER_SITE_OTHER): Payer: Medicare Other | Admitting: Adult Health

## 2021-12-01 DIAGNOSIS — G4733 Obstructive sleep apnea (adult) (pediatric): Secondary | ICD-10-CM

## 2021-12-01 DIAGNOSIS — Z9989 Dependence on other enabling machines and devices: Secondary | ICD-10-CM | POA: Diagnosis not present
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
## 2021-12-02 NOTE — Progress Notes (Signed)
Ross Ludwig, RN; Dimas Millin Got it      Previous Messages   ----- Message -----  From: Guy Begin, RN  Sent: 12/01/2021   3:21 PM EST  To: Dimas Millin, Jari Favre, *  Subject: mask refitt, try dreamwear full face           Charles [name]  Male, 70 y.o., [birth year]  MRN:  [id number]  Phone:  [phone] (M)   New order in epic, mask refit, try dreamwear full face.   Thanks SY

## 2022-01-15 ENCOUNTER — Other Ambulatory Visit: Payer: Self-pay | Admitting: Internal Medicine

## 2022-01-15 DIAGNOSIS — M542 Cervicalgia: Secondary | ICD-10-CM

## 2022-02-04 ENCOUNTER — Other Ambulatory Visit: Payer: Self-pay

## 2022-02-04 ENCOUNTER — Ambulatory Visit
Admission: RE | Admit: 2022-02-04 | Discharge: 2022-02-04 | Disposition: A | Discharge status: Home / Self Care | Payer: Medicare Other | Source: Ambulatory Visit | Attending: Internal Medicine | Admitting: Internal Medicine

## 2022-02-04 DIAGNOSIS — M542 Cervicalgia: Secondary | ICD-10-CM

## 2022-11-29 ENCOUNTER — Encounter: Payer: Self-pay | Admitting: *Deleted
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
## 2022-11-30 ENCOUNTER — Telehealth (INDEPENDENT_AMBULATORY_CARE_PROVIDER_SITE_OTHER): Payer: Medicare Other | Admitting: Adult Health

## 2022-11-30 DIAGNOSIS — G4733 Obstructive sleep apnea (adult) (pediatric): Secondary | ICD-10-CM

## 2022-12-22 ENCOUNTER — Telehealth: Payer: Self-pay | Admitting: Adult Health

## 2022-12-22 NOTE — Telephone Encounter (Signed)
Patient called back.  HST- [employer] medicare no auth req   He is scheduled at [facility] for 01/04/23 at 2:15 pm.  Mailed packet to the patient.

## 2022-12-22 NOTE — Telephone Encounter (Signed)
[employer] medicare no auth req  left VM 12/21/22 [place]

## 2023-01-04 ENCOUNTER — Ambulatory Visit (INDEPENDENT_AMBULATORY_CARE_PROVIDER_SITE_OTHER): Payer: Medicare Other | Admitting: Neurology

## 2023-01-04 DIAGNOSIS — G4733 Obstructive sleep apnea (adult) (pediatric): Secondary | ICD-10-CM | POA: Diagnosis not present

## 2023-01-06 NOTE — Progress Notes (Signed)
See procedure note.

## 2023-01-10 NOTE — Procedures (Signed)
[facility]  HOME SLEEP TEST (Watch PAT) REPORT (Study was interpreted on behalf of Dr. Brett Fairy)    STUDY DATE: 01/04/2023  DOB: December 05, 1951  MRN: [id number]  ORDERING CLINICIAN: Star Age, MD, PhD   REFERRING CLINICIAN: Donnajean Lopes, MD (PCP), Ward Givens, NP/Dr. Dohmeier (Sleep)  CLINICAL INFORMATION/HISTORY: 72 year old male with an underlying medical history of arthritis with status post bilateral hip replacements, hypertension, and mild obesity, who presents for a reevaluation of his obstructive sleep apnea.  He has been on AutoPap therapy with excellent compliance and good results, good apnea control as well as low incidences of mask leakage.  He has lost quite a bit of weight over time.  FINDINGS:   Sleep Summary:   Total Recording Time (hours, min): 9 hours, 55 min  Total Sleep Time (hours, min):  8 hours, 35 min  Percent REM (%):    24.7%   Respiratory Indices:   Calculated pAHI (per hour):  9.7/hour         REM pAHI:    14.6/hour       NREM pAHI: 8.1/hour  Central pAHI: 0.8/hour  Oxygen Saturation Statistics:    Oxygen Saturation (%) Mean: 94%   Minimum oxygen saturation (%):                 86%   O2 Saturation Range (%): 86-98%    O2 Saturation (minutes) <=88%: 1.5 min  Pulse Rate Statistics:   Pulse Mean (bpm):    65/min    Pulse Range (49 - 99/min)   IMPRESSION: OSA (obstructive sleep apnea), mild  RECOMMENDATION:  This home sleep test demonstrates overall mild obstructive sleep apnea with a total AHI of 9.7/hour and O2 nadir of 86%. Snoring was detected, ranging from mild to moderate, at times louder.  Ongoing treatment with positive airway pressure such as AutoPap therapy is recommended if the patient has benefited from treatment and would like to continue with PAP therapy.  If he qualifies for new equipment, settings can be kept on the same levels as previously, as he has had good apnea control and low air leakage  from the mask/interface. Alternative treatments may include weight loss (where appropriate) along with avoidance of the supine sleep position (if possible), or an oral appliance in appropriate candidates.   Please note that untreated obstructive sleep apnea may carry additional perioperative morbidity. Patients with significant obstructive sleep apnea should receive perioperative PAP therapy and the surgeons and particularly the anesthesiologist should be informed of the diagnosis and the severity of the sleep disordered breathing. The patient should be cautioned not to drive, work at heights, or operate dangerous or heavy equipment when tired or sleepy. Review and reiteration of good sleep hygiene measures should be pursued with any patient. Other causes of the patient's symptoms, including circadian rhythm disturbances, an underlying mood disorder, medication effect and/or an underlying medical problem cannot be ruled out based on this test. Clinical correlation is recommended.  The patient and his referring provider will be notified of the test results. The patient will be seen in follow up in sleep clinic at [facility], as planned.   I certify that I have reviewed the raw data recording prior to the issuance of this report in accordance with the standards of the American Academy of Sleep Medicine (AASM).  INTERPRETING PHYSICIAN:   Star Age, MD, PhD Medical Director, [facility]) [place], ABPN (Neurology and Sleep)   [facility] [address],  [place], [place] [address] [phone]

## 2023-06-14 ENCOUNTER — Ambulatory Visit: Payer: Medicare Other | Admitting: Cardiology

## 2023-08-22 ENCOUNTER — Encounter: Payer: Self-pay | Admitting: Cardiology

## 2023-08-22 ENCOUNTER — Ambulatory Visit: Payer: Medicare Other | Attending: Cardiology | Admitting: Cardiology

## 2023-08-22 VITALS — BP 126/72 | HR 65 | Ht 72.0 in | Wt 200.6 lb

## 2023-08-22 DIAGNOSIS — I10 Essential (primary) hypertension: Secondary | ICD-10-CM | POA: Diagnosis not present

## 2023-08-22 NOTE — Patient Instructions (Signed)
Medication Instructions:  The current medical regimen is effective;  continue present plan and medications.  *If you need a refill on your cardiac medications before your next appointment, please call your pharmacy*  Testing/Procedures: Your physician has requested that you have a Coronary Calcium score which is completed by CT. Cardiac computed tomography (CT) is a painless test that uses an x-ray machine to take clear, detailed pictures of your heart. There are no instructions for this testing.  You may eat/drink and take your normal medications this day.  The cost of the testing is $99 due at the time of your appointment.  Follow-Up: At [facility], you and your health needs are our priority.  As part of our continuing mission to provide you with exceptional heart care, we have created designated Provider Care Teams.  These Care Teams include your primary Cardiologist (physician) and Advanced Practice Providers (APPs -  Physician Assistants and Nurse Practitioners) who all work together to provide you with the care you need, when you need it.  We recommend signing up for the patient portal called "MyChart".  Sign up information is provided on this After Visit Summary.  MyChart is used to connect with patients for Virtual Visits (Telemedicine).  Patients are able to view lab/test results, encounter notes, upcoming appointments, etc.  Non-urgent messages can be sent to your provider as well.   To learn more about what you can do with MyChart, go to [email or web address].    Your next appointment:   Follow up based on the results of the above testing.
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
## 2023-08-30 ENCOUNTER — Ambulatory Visit (HOSPITAL_BASED_OUTPATIENT_CLINIC_OR_DEPARTMENT_OTHER)
Admission: RE | Admit: 2023-08-30 | Discharge: 2023-08-30 | Disposition: A | Discharge status: Home / Self Care | Payer: Medicare Other | Source: Ambulatory Visit | Attending: Cardiology | Admitting: Cardiology

## 2023-08-30 DIAGNOSIS — I10 Essential (primary) hypertension: Secondary | ICD-10-CM | POA: Insufficient documentation

## 2023-08-31 ENCOUNTER — Encounter: Payer: Self-pay | Admitting: Cardiology

## 2023-09-01 ENCOUNTER — Telehealth (HOSPITAL_BASED_OUTPATIENT_CLINIC_OR_DEPARTMENT_OTHER): Payer: Self-pay | Admitting: *Deleted

## 2023-09-01 ENCOUNTER — Encounter: Payer: Self-pay | Admitting: Cardiology

## 2023-09-01 DIAGNOSIS — R9389 Abnormal findings on diagnostic imaging of other specified body structures: Secondary | ICD-10-CM

## 2023-09-01 DIAGNOSIS — I2510 Atherosclerotic heart disease of native coronary artery without angina pectoris: Secondary | ICD-10-CM

## 2023-09-01 MED ORDER — ASPIRIN 81 MG PO TBEC: 81.0000 mg | DELAYED_RELEASE_TABLET | Freq: Every day | ORAL | Status: AC

## 2023-09-01 NOTE — Telephone Encounter (Signed)
Given the extent of CAD/calcification as well as abnormal ECG with possible inferior ischemia let's check a PET stress test to look for significant ischemia.   Recommend ASA 81mg .   Continue Repatha.   Follow up in one year or sooner if symptoms develop.   Order placed for PET scan and ASA 81 mg daily.  Pt aware he will be contacted to be scheduled.  Instructions sent to pt via MyChart

## 2023-09-20 ENCOUNTER — Telehealth: Payer: Self-pay | Admitting: Cardiology

## 2023-09-20 NOTE — Telephone Encounter (Signed)
Pt's insurance calling to request that recent note be sent. Please advise

## 2023-09-21 NOTE — Telephone Encounter (Signed)
[employer] is calling to report the appeal has been approved.   The authorization # is [id number].   A copy of approval is being sent via mail. This also will be updated within the portal.

## 2023-09-21 NOTE — Telephone Encounter (Signed)
Patient is calling back to check on the status of appeal approval.   Advised patient of this previous documentation with his insurance requesting recent OV note.   He is requesting a callback with an update once one is received from his insurance.   Please advise.

## 2023-09-26 NOTE — Telephone Encounter (Signed)
PET scan scheduled for 10/18/23.

## 2023-10-13 ENCOUNTER — Encounter (HOSPITAL_COMMUNITY): Payer: Self-pay

## 2023-10-17 ENCOUNTER — Telehealth (HOSPITAL_COMMUNITY): Payer: Self-pay | Admitting: Emergency Medicine

## 2023-10-17 NOTE — Telephone Encounter (Signed)
Reaching out to patient to offer assistance regarding upcoming cardiac imaging study; pt verbalizes understanding of appt date/time, parking situation and where to check in, pre-test NPO status and medications ordered, and verified current allergies; name and call back number provided for further questions should they arise Cayne Yom RN Navigator Cardiac Imaging [facility] [phone] office [phone] cell 

## 2023-10-18 ENCOUNTER — Encounter (HOSPITAL_COMMUNITY)
Admission: RE | Admit: 2023-10-18 | Discharge: 2023-10-18 | Disposition: A | Discharge status: Home / Self Care | Payer: Medicare Other | Source: Ambulatory Visit | Attending: Cardiology | Admitting: Cardiology

## 2023-10-18 DIAGNOSIS — R9389 Abnormal findings on diagnostic imaging of other specified body structures: Secondary | ICD-10-CM

## 2023-10-18 DIAGNOSIS — I2583 Coronary atherosclerosis due to lipid rich plaque: Secondary | ICD-10-CM | POA: Diagnosis not present

## 2023-10-18 DIAGNOSIS — I2510 Atherosclerotic heart disease of native coronary artery without angina pectoris: Secondary | ICD-10-CM

## 2023-10-18 LAB — NM PET CT CARDIAC PERFUSION MULTI W/ABSOLUTE BLOODFLOW
MBFR: 3.33
Nuc Rest EF: 45 %
Nuc Stress EF: 58 %
Rest MBF: 0.58 ml/g/min
Rest Nuclear Isotope Dose: 24 mCi
ST Depression (mm): 0 mm
Stress MBF: 1.93 ml/g/min
Stress Nuclear Isotope Dose: 23.3 mCi
TID: 0.94

## 2023-10-18 MED ORDER — RUBIDIUM RB82 GENERATOR (RUBYFILL)
24.0000 | PACK | Freq: Once | INTRAVENOUS | Status: AC
  Administered 2023-10-18: 23.34 via INTRAVENOUS

## 2023-10-18 MED ORDER — REGADENOSON 0.4 MG/5ML IV SOLN
INTRAVENOUS | Status: AC
Start: 2023-10-18 — End: ?
  Filled 2023-10-18: qty 5

## 2023-10-18 MED ORDER — RUBIDIUM RB82 GENERATOR (RUBYFILL)
24.0000 | PACK | Freq: Once | INTRAVENOUS | Status: AC
  Administered 2023-10-18: 23.98 via INTRAVENOUS

## 2023-10-18 MED ORDER — REGADENOSON 0.4 MG/5ML IV SOLN
0.4000 mg | Freq: Once | INTRAVENOUS | Status: AC
  Administered 2023-10-18: 0.4 mg via INTRAVENOUS

## 2023-10-18 NOTE — Progress Notes (Signed)
Pt tolerated stress test, no reports of symptoms. Able to ambulate independently back to waiting room.

## 2023-10-19 ENCOUNTER — Encounter: Payer: Self-pay | Admitting: Cardiology

## 2024-05-25 ENCOUNTER — Telehealth: Payer: Self-pay | Admitting: Adult Health

## 2024-05-25 NOTE — Telephone Encounter (Signed)
 Pt called and made a Cpap f/u. Pt is needing a new Rx sent to [facility] for more supplies. Please advise.

## 2024-05-28 NOTE — Telephone Encounter (Signed)
 LVM  not able to order cpap supplies until office visit 08/2024 with Megan,NP . Informed patient placed on wait list for sooner appointment . If patient calls back please make him aware unable to order cpap supplies until office visit  Insurance requires and updated office visit for approval for cpap supplies . Pt last appointment 11/2022

## 2024-05-29 ENCOUNTER — Telehealth: Payer: Self-pay | Admitting: Cardiology

## 2024-05-29 NOTE — Telephone Encounter (Signed)
 Patient would like to complete a provider switch from Dr. Renna Cary to Dr. Berry Bristol. Patient stated Dr. Berry Bristol sees his wife and that's the only reason for the switch. Do both of you approve the provider switch?

## 2024-05-29 NOTE — Telephone Encounter (Signed)
 Go for it

## 2024-07-10 ENCOUNTER — Ambulatory Visit (INDEPENDENT_AMBULATORY_CARE_PROVIDER_SITE_OTHER): Payer: Self-pay | Admitting: Adult Health

## 2024-07-10 ENCOUNTER — Encounter: Payer: Self-pay | Admitting: Adult Health

## 2024-07-10 VITALS — BP 122/77 | HR 62 | Ht 71.0 in | Wt 188.0 lb

## 2024-07-10 DIAGNOSIS — G4733 Obstructive sleep apnea (adult) (pediatric): Secondary | ICD-10-CM

## 2024-07-10 NOTE — Patient Instructions (Signed)
 Continue using CPAP nightly and greater than 4 hours each night If your symptoms worsen or you develop new symptoms please let us know.
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
## 2024-08-17 ENCOUNTER — Ambulatory Visit: Admitting: Cardiology

## 2024-08-27 ENCOUNTER — Ambulatory Visit: Admitting: Adult Health

## 2024-09-20 ENCOUNTER — Ambulatory Visit: Admitting: Cardiology

## 2024-10-17 ENCOUNTER — Ambulatory Visit: Attending: Cardiology | Admitting: Cardiology

## 2024-10-17 ENCOUNTER — Telehealth: Payer: Self-pay

## 2024-10-17 VITALS — BP 137/82 | HR 64 | Ht 71.5 in | Wt 193.2 lb

## 2024-10-17 DIAGNOSIS — I10 Essential (primary) hypertension: Secondary | ICD-10-CM

## 2024-10-17 DIAGNOSIS — I2510 Atherosclerotic heart disease of native coronary artery without angina pectoris: Secondary | ICD-10-CM

## 2024-10-17 DIAGNOSIS — I2583 Coronary atherosclerosis due to lipid rich plaque: Secondary | ICD-10-CM | POA: Diagnosis not present

## 2024-10-17 DIAGNOSIS — G4733 Obstructive sleep apnea (adult) (pediatric): Secondary | ICD-10-CM

## 2024-10-17 NOTE — Telephone Encounter (Signed)
 Left message for patient to return the call about his appt today.

## 2024-10-17 NOTE — Progress Notes (Signed)
 Cardiology Office Note:  .   Date:  10/17/2024  ID:  Charles [name], DOB 05-Mar-1951, MRN [id number] PCP: Yolande Toribio MATSU, MD  [facility] Providers Cardiologist:  Gordy Bergamo, MD {  History of Present Illness: .   Charles [name] is a 73 y.o. male with history of coronary calcifications on CT, hypertension, hyperlipidemia, OSA, GERD, OSA on CPAP       Social history  Former smoker of 30+ years ago.  Occasionally drinks beers Exercising greater than 5 days/week.  Hybrid elliptical/recumbent bike/treadmill.      Patient with no significant prior cardiac history.  Recently CRP showed 5.7 and was sent to cardiology for further evaluation.  Asymptomatic without any cardiac complaints.  CT scoring was ordered 08/2023 demonstrating CAC score 1090.  84th percentile.  41 mm ascending aortic dilatation.  10/2023 had normal NM PET stress test with normal global blood flow reserve.  Calcifications noted in LAD and RCA.  Today patient presents for follow-up to discuss his results.  He says his insurance company offered prophylactic screening and that is why he had a CRP done.  He has no acute complaints and has done extremely well and very active.  As above he is exercising at least 5+ days per week and having no exertional symptoms.  He has history of OSA and compliant with the CPAP and the rest of his medications.  ROS: Denies: Chest pain, shortness of breath, orthopnea, peripheral edema, palpitations, decreased exercise intolerance, fatigue, lightheadedness.   Studies Reviewed: SABRA    EKG Interpretation Date/Time:  Wednesday October 17 2024 13:25:33 EST Ventricular Rate:  61 PR Interval:  154 QRS Duration:  86 QT Interval:  392 QTC Calculation: 394 R Axis:   59  Text Interpretation: Normal sinus rhythm Normal ECG When compared with ECG of 22-Aug-2023 14:53, No significant change was found Confirmed by Darryle Currier [phone]) on 10/17/2024 1:34:35 PM    Risk Assessment/Calculations:              Physical Exam:   VS:  BP 137/82 (BP Location: Left Arm, Patient Position: Sitting)   Pulse 64   Ht 5' 11.5 (1.816 m)   Wt 193 lb 3.2 oz (87.6 kg)   SpO2 99%   BMI 26.57 kg/m    Wt Readings from Last 3 Encounters:  10/17/24 193 lb 3.2 oz (87.6 kg)  07/10/24 188 lb (85.3 kg)  08/22/23 200 lb 9.6 oz (91 kg)    GEN: Well nourished, well developed in no acute distress NECK: No JVD; No carotid bruits CARDIAC: RRR, no murmurs, rubs, gallops RESPIRATORY:  Clear to auscultation without rales, wheezing or rhonchi  ABDOMEN: Soft, non-tender, non-distended EXTREMITIES:  No edema; No deformity   ASSESSMENT AND PLAN: .    Coronary artery calcifications Hyperlipidemia -08/2023 CAC score of 1090 -09/2023 NM PET stress normal For now medically manage and monitor symptoms closely.  He has had no exertional symptoms or anything that would prompt invasive evaluation at this point.  We discussed symptoms that would warrant further evaluation.  He will let us  know if any of these occur. Continue with aspirin , Repatha, Toprol XL 25 mg. LDL is well-controlled.  01/2024 LDL was 28.  Hypertension Possible whitecoat syndrome.  Blood pressure not terrible but 137/80.  At home and another doctor's office this is well-controlled around 120 systolics.  Continue with amlodipine -benazepril 5-20 mg daily.  OSA  Compliant with CPAP.  Aortic dilatation 4.1 cm on CT.  We will follow  this annually.    Dispo: 1 year follow-up with Dr. Ladona per his request.   Signed, Thom LITTIE Sluder, PA-C

## 2024-10-17 NOTE — Telephone Encounter (Signed)
 Pt returning call - please call [phone]

## 2024-10-17 NOTE — Patient Instructions (Addendum)
 Medication Instructions:  Your physician recommends that you continue on your current medications as directed. Please refer to the Current Medication list given to you today.  *If you need a refill on your cardiac medications before your next appointment, please call your pharmacy*   Follow-Up: At [facility], you and your health needs are our priority.  As part of our continuing mission to provide you with exceptional heart care, our providers are all part of one team.  This team includes your primary Cardiologist (physician) and Advanced Practice Providers or APPs (Physician Assistants and Nurse Practitioners) who all work together to provide you with the care you need, when you need it.  Your next appointment:   1 year(s)  Provider:   Gordy Bergamo, MD

## 2024-10-17 NOTE — Telephone Encounter (Signed)
 Spoke to pt.

## 2025-07-11 ENCOUNTER — Ambulatory Visit: Admitting: Adult Health
